# Patient Record
Sex: Female | Born: 1990 | Race: Black or African American | Hispanic: No | Marital: Single | Smoking: Never smoker
Health system: Southern US, Community
[De-identification: ages and names within clinical notes are randomized; demographics above are authoritative.]

## PROBLEM LIST (undated history)

## (undated) DIAGNOSIS — I10 Essential (primary) hypertension: Secondary | ICD-10-CM

## (undated) DIAGNOSIS — K219 Gastro-esophageal reflux disease without esophagitis: Secondary | ICD-10-CM

## (undated) HISTORY — DX: Essential (primary) hypertension: I10

---
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
## 2010-02-26 ENCOUNTER — Emergency Department: Payer: Self-pay | Admitting: Emergency Medicine

## 2010-02-27 ENCOUNTER — Emergency Department: Payer: Self-pay | Admitting: Emergency Medicine

## 2010-05-24 ENCOUNTER — Ambulatory Visit: Payer: Self-pay | Admitting: Family Medicine

## 2010-05-28 ENCOUNTER — Emergency Department: Payer: Self-pay | Admitting: Emergency Medicine

## 2010-05-30 ENCOUNTER — Other Ambulatory Visit: Payer: Self-pay | Admitting: Emergency Medicine

## 2010-06-04 ENCOUNTER — Inpatient Hospital Stay (HOSPITAL_COMMUNITY)
Admission: AD | Admit: 2010-06-04 | Discharge: 2010-06-04 | Payer: Self-pay | Source: Home / Self Care | Attending: Obstetrics & Gynecology | Admitting: Obstetrics & Gynecology

## 2010-08-27 ENCOUNTER — Emergency Department: Payer: Self-pay | Admitting: Emergency Medicine

## 2010-10-16 ENCOUNTER — Observation Stay: Payer: Self-pay

## 2010-11-18 ENCOUNTER — Observation Stay: Payer: Self-pay

## 2011-01-07 ENCOUNTER — Observation Stay: Payer: Self-pay | Admitting: Obstetrics and Gynecology

## 2011-01-23 ENCOUNTER — Inpatient Hospital Stay: Payer: Self-pay | Admitting: Obstetrics and Gynecology

## 2011-05-16 ENCOUNTER — Emergency Department: Payer: Self-pay | Admitting: Emergency Medicine

## 2011-12-22 ENCOUNTER — Emergency Department: Payer: Self-pay | Admitting: Emergency Medicine

## 2011-12-22 LAB — URINALYSIS, COMPLETE
Bilirubin,UR: NEGATIVE
Glucose,UR: NEGATIVE mg/dL (ref 0–75)
Ph: 6 (ref 4.5–8.0)
RBC,UR: 2 /HPF (ref 0–5)
Squamous Epithelial: 16

## 2011-12-22 LAB — COMPREHENSIVE METABOLIC PANEL
Albumin: 3.5 g/dL (ref 3.4–5.0)
Alkaline Phosphatase: 91 U/L (ref 50–136)
Anion Gap: 8 (ref 7–16)
Calcium, Total: 9.1 mg/dL (ref 8.5–10.1)
Co2: 27 mmol/L (ref 21–32)
Creatinine: 0.77 mg/dL (ref 0.60–1.30)
SGOT(AST): 20 U/L (ref 15–37)
SGPT (ALT): 18 U/L
Total Protein: 8.6 g/dL — ABNORMAL HIGH (ref 6.4–8.2)

## 2011-12-22 LAB — CBC
HCT: 42.4 % (ref 35.0–47.0)
HGB: 14 g/dL (ref 12.0–16.0)
MCHC: 33 g/dL (ref 32.0–36.0)

## 2011-12-22 LAB — HCG, QUANTITATIVE, PREGNANCY: Beta Hcg, Quant.: <1 m[IU]/mL — ABNORMAL LOW

## 2011-12-23 LAB — URINE CULTURE

## 2012-10-24 IMAGING — US US OB < 14 WEEKS - US OB TV
1 series · 17 of 28 positions shown · non-contrast
Comparison: none

REASON FOR EXAM: eval for ectopic
COMMENTS:

[Series 1: us ob < 14 weeks - us ob tv · 17 of 69 slices shown]
[im 1/69]
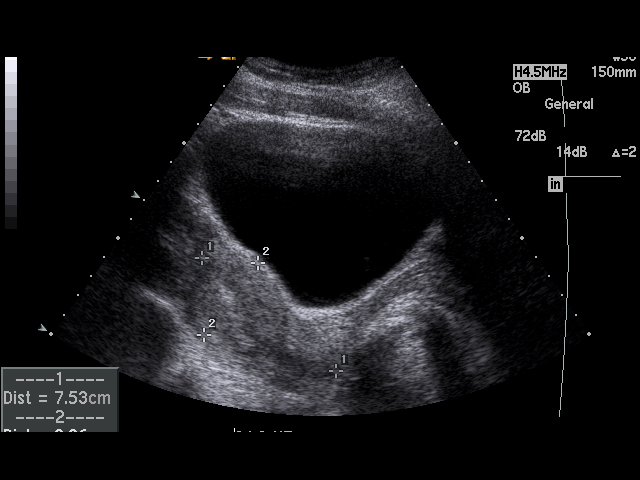
[im 6/69]
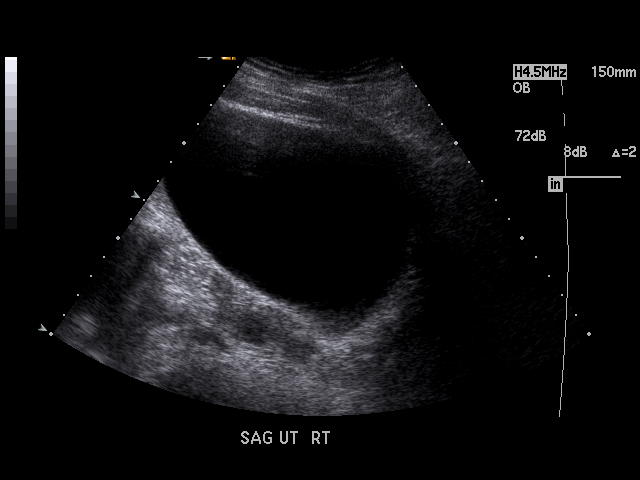
[im 11/69]
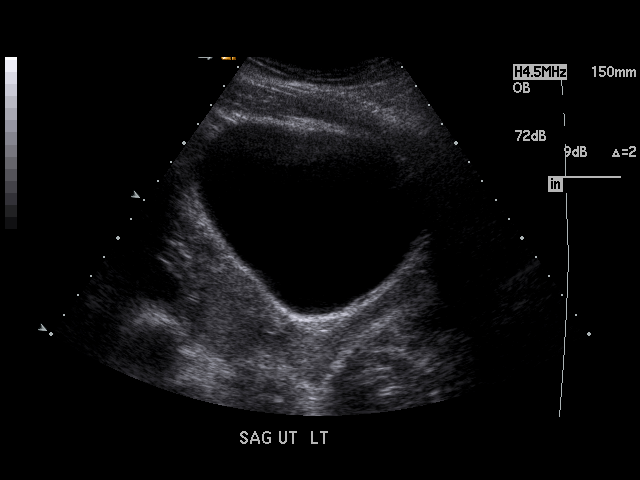
[im 13/69]
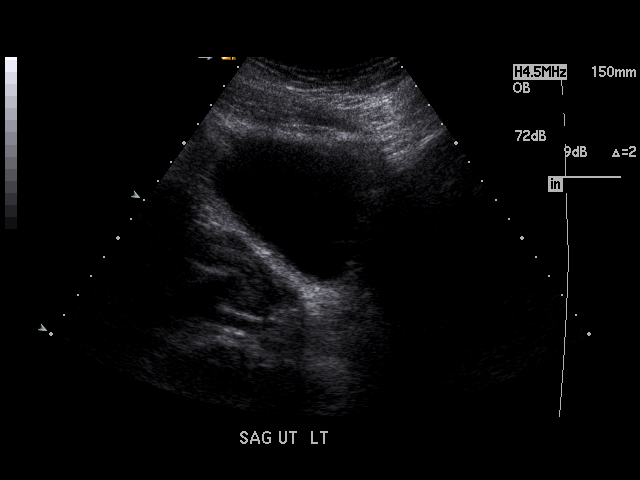
[im 18/69]
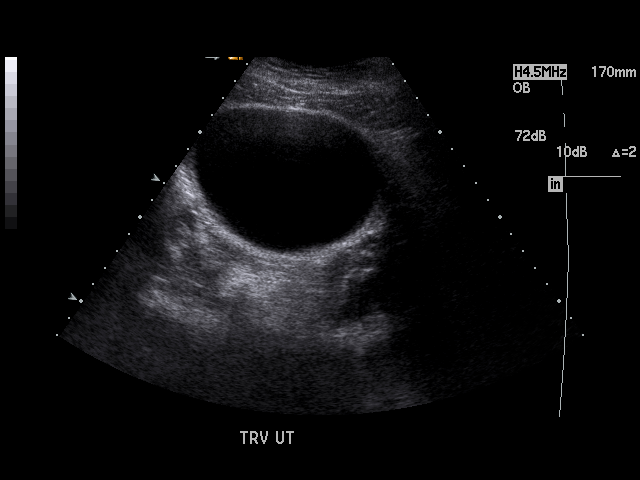
[im 23/69]
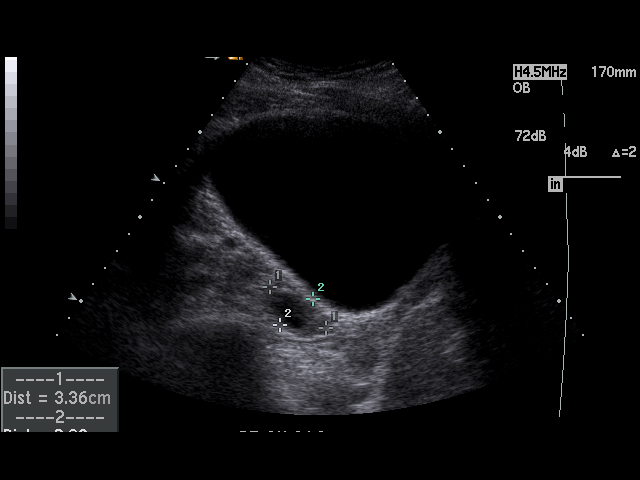
[im 26/69]
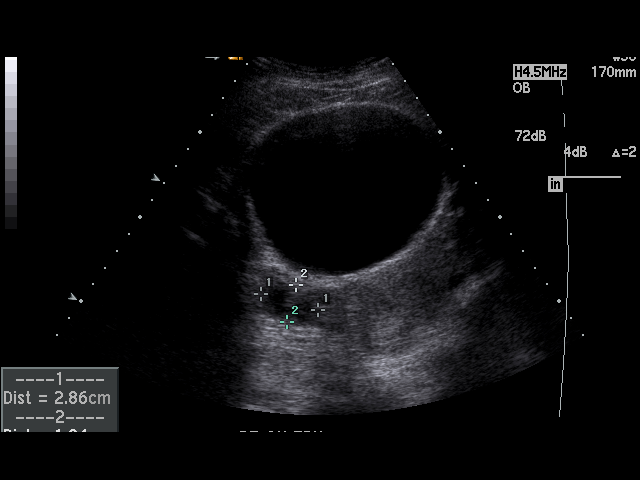
[im 31/69]
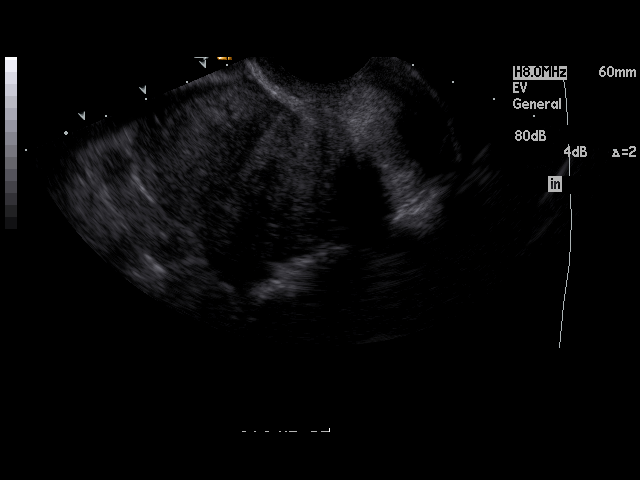
[im 36/69]
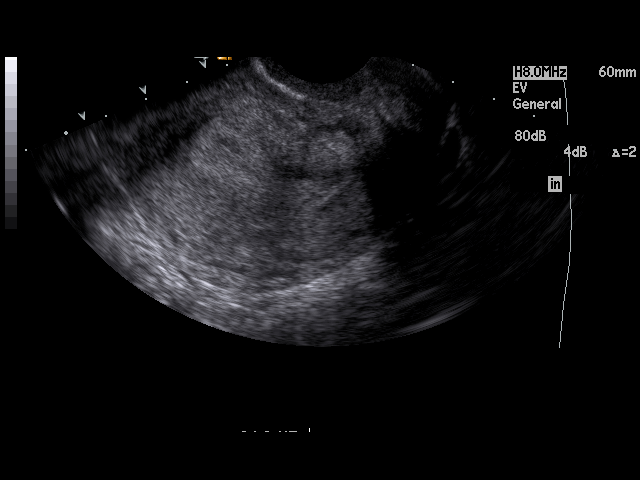
[im 38/69]
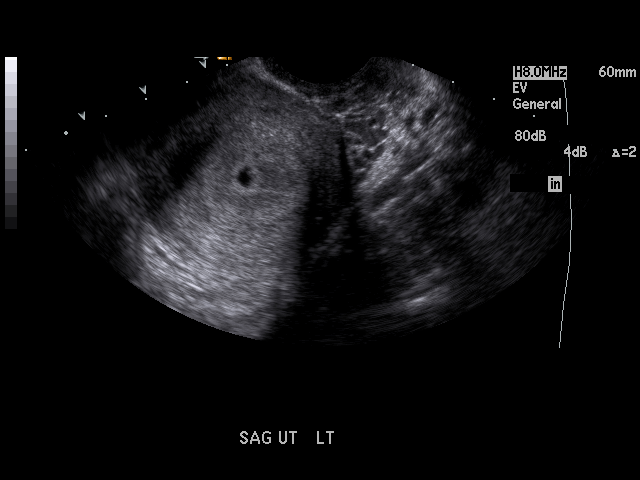
[im 43/69]
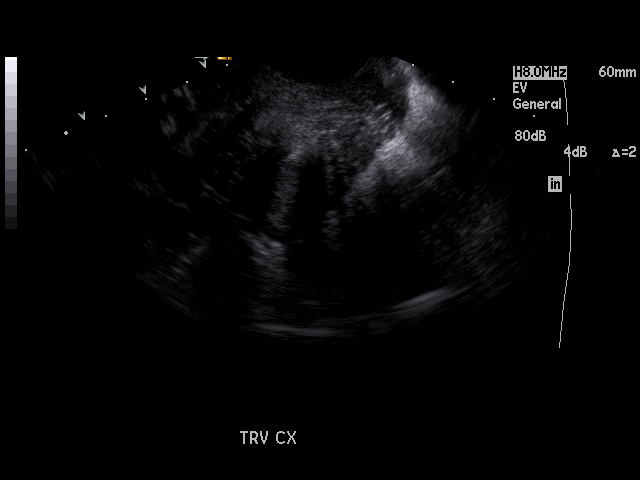
[im 46/69]
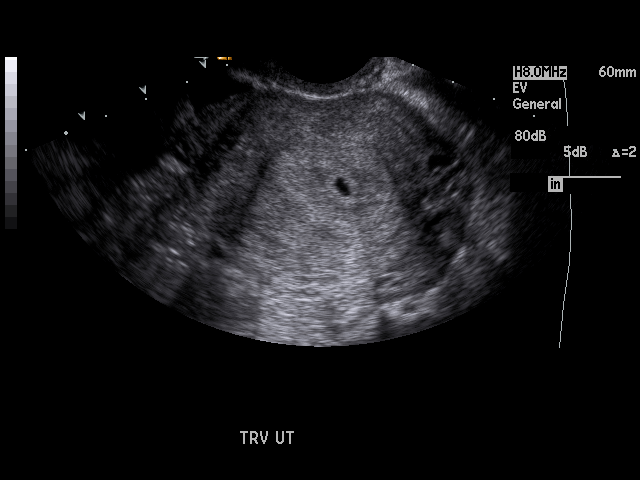
[im 51/69]
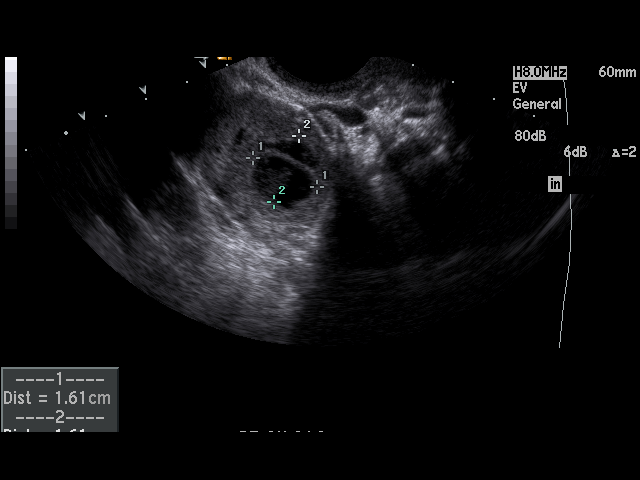
[im 56/69]
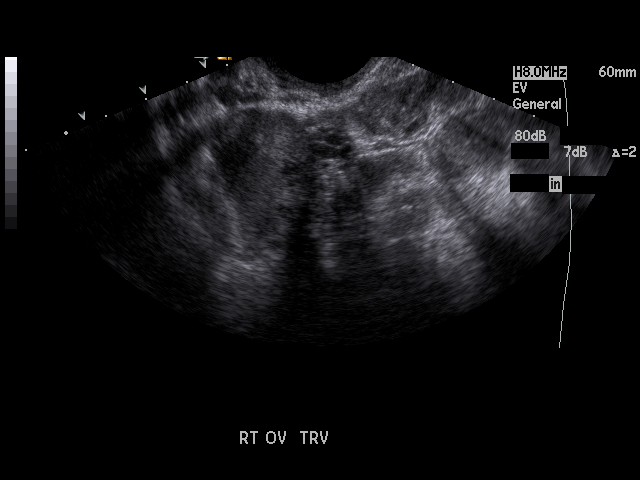
[im 58/69]
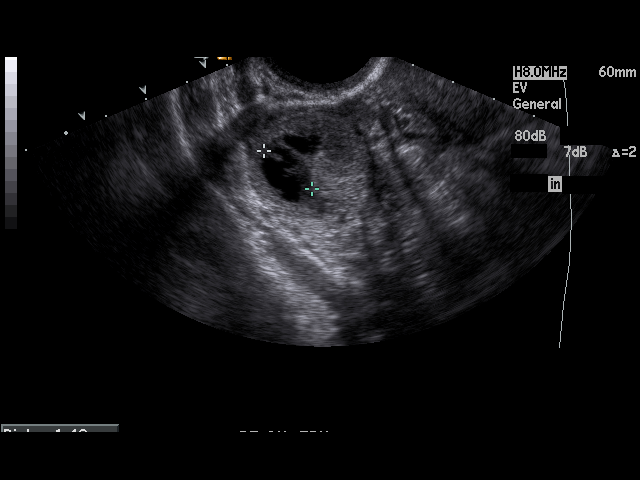
[im 63/69]
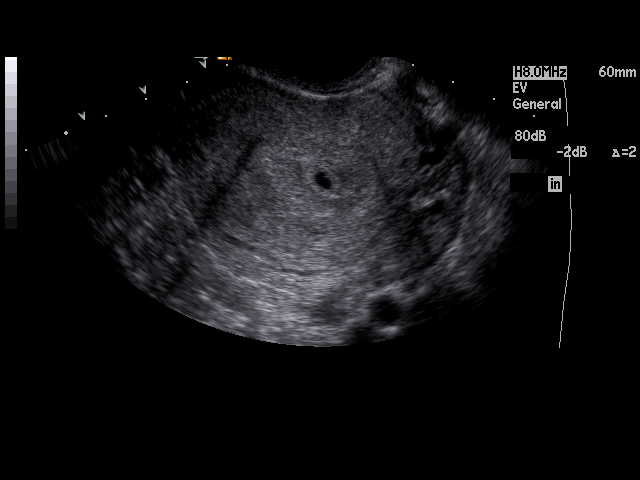
[im 69/69]
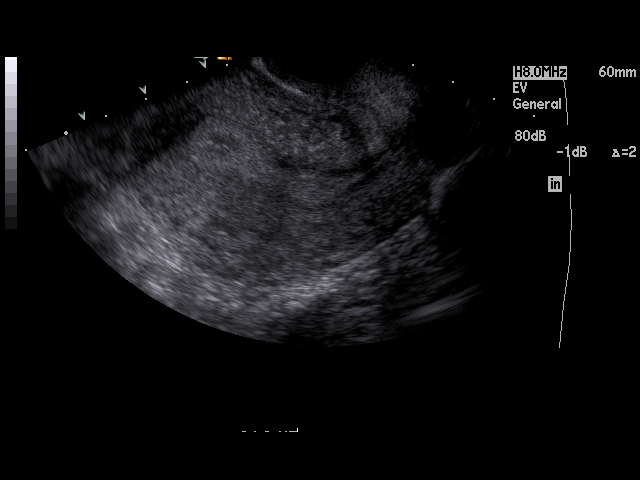

[17 of 28 positions shown; findings below may reference images not displayed]

PROCEDURE:     US  - US OB LESS THAN 14 WEEKS/W TRANS  - May 28, 2010  [DATE]

RESULT:     Emergent transabdominal and endovaginal pelvic ultrasound is
performed. The study demonstrates a small intrauterine fluid collection
suggestive of a possible gestational sac with a measurement of 4.7 mm. A
pseudogestational sac of an ectopic pregnancy is not excluded. No yolk sac
or fetal pole is  evident at this time. Certainly a very early intrauterine
gestation is not excluded. Followup with quantitative serial beta-hCG exams
and ultrasound is recommended. The ovaries appear to be normal in size.
There is a predominantly cystic irregularly marginated area of echogenicity
within the right ovary with septations and internal echoes but with no
evidence of blood flow. This is nonspecific and should be followed. The
blood flow to both ovaries appears to be normal on color Doppler
interrogation.
IMPRESSION: Fluid collection within the endometrial cavity which may represent a very
early gestational sac. Correlation with serial quantitative beta-hCG and
followup ultrasound is recommended. No yolk sac or fetal pole is evident.
The gestational sac is too small to characterize a gestational age. The
possibility of this being a pseudogestational sac of an ectopic pregnancy is
not excluded. Close clinical and laboratory correlation is recommended.

## 2013-03-29 ENCOUNTER — Encounter (HOSPITAL_COMMUNITY): Payer: Self-pay | Admitting: Emergency Medicine

## 2013-03-29 ENCOUNTER — Emergency Department (HOSPITAL_COMMUNITY)
Admission: EM | Admit: 2013-03-29 | Discharge: 2013-03-29 | Disposition: A | Discharge status: Home / Self Care | Payer: Managed Care, Other (non HMO) | Attending: Emergency Medicine | Admitting: Emergency Medicine

## 2013-03-29 DIAGNOSIS — M549 Dorsalgia, unspecified: Secondary | ICD-10-CM | POA: Insufficient documentation

## 2013-03-29 DIAGNOSIS — Z3202 Encounter for pregnancy test, result negative: Secondary | ICD-10-CM | POA: Insufficient documentation

## 2013-03-29 DIAGNOSIS — R112 Nausea with vomiting, unspecified: Secondary | ICD-10-CM | POA: Insufficient documentation

## 2013-03-29 DIAGNOSIS — R5381 Other malaise: Secondary | ICD-10-CM | POA: Insufficient documentation

## 2013-03-29 DIAGNOSIS — R509 Fever, unspecified: Secondary | ICD-10-CM | POA: Insufficient documentation

## 2013-03-29 DIAGNOSIS — J020 Streptococcal pharyngitis: Secondary | ICD-10-CM | POA: Insufficient documentation

## 2013-03-29 DIAGNOSIS — R630 Anorexia: Secondary | ICD-10-CM | POA: Insufficient documentation

## 2013-03-29 LAB — CBC WITH DIFFERENTIAL/PLATELET
Basophils Absolute: 0 10*3/uL (ref 0.0–0.1)
Eosinophils Absolute: 0.2 10*3/uL (ref 0.0–0.7)
Eosinophils Relative: 1 % (ref 0–5)
MCH: 27.7 pg (ref 26.0–34.0)
MCV: 82 fL (ref 78.0–100.0)
Neutrophils Relative %: 84 % — ABNORMAL HIGH (ref 43–77)
Platelets: 269 10*3/uL (ref 150–400)
RDW: 13.6 % (ref 11.5–15.5)
WBC: 12.8 10*3/uL — ABNORMAL HIGH (ref 4.0–10.5)

## 2013-03-29 LAB — URINALYSIS, ROUTINE W REFLEX MICROSCOPIC
Bilirubin Urine: NEGATIVE
Glucose, UA: NEGATIVE mg/dL
Hgb urine dipstick: NEGATIVE
Nitrite: NEGATIVE
Specific Gravity, Urine: 1.026 (ref 1.005–1.030)
pH: 8 (ref 5.0–8.0)

## 2013-03-29 LAB — COMPREHENSIVE METABOLIC PANEL
ALT: 16 U/L (ref 0–35)
AST: 22 U/L (ref 0–37)
Albumin: 3.7 g/dL (ref 3.5–5.2)
Calcium: 9.5 mg/dL (ref 8.4–10.5)
Potassium: 4 mEq/L (ref 3.5–5.1)
Sodium: 133 mEq/L — ABNORMAL LOW (ref 135–145)
Total Protein: 8.3 g/dL (ref 6.0–8.3)

## 2013-03-29 MED ORDER — PENICILLIN G BENZATHINE 1200000 UNIT/2ML IM SUSP
1.2000 10*6.[IU] | Freq: Once | INTRAMUSCULAR | Status: AC
  Administered 2013-03-29: 1.2 10*6.[IU] via INTRAMUSCULAR
  Filled 2013-03-29: qty 2

## 2013-03-29 MED ORDER — DEXAMETHASONE SODIUM PHOSPHATE 10 MG/ML IJ SOLN
10.0000 mg | Freq: Once | INTRAMUSCULAR | Status: AC
  Administered 2013-03-29: 10 mg via INTRAMUSCULAR
  Filled 2013-03-29: qty 1

## 2013-03-29 MED ORDER — KETOROLAC TROMETHAMINE 60 MG/2ML IM SOLN
60.0000 mg | Freq: Once | INTRAMUSCULAR | Status: AC
  Administered 2013-03-29: 60 mg via INTRAMUSCULAR
  Filled 2013-03-29: qty 2

## 2013-03-29 MED ORDER — ONDANSETRON 4 MG PO TBDP
8.0000 mg | ORAL_TABLET | Freq: Once | ORAL | Status: AC
  Administered 2013-03-29: 8 mg via ORAL
  Filled 2013-03-29: qty 2

## 2013-03-29 MED ORDER — ACETAMINOPHEN 325 MG PO TABS
650.0000 mg | ORAL_TABLET | Freq: Once | ORAL | Status: AC
  Administered 2013-03-29: 650 mg via ORAL
  Filled 2013-03-29: qty 2

## 2013-03-29 MED ORDER — NAPROXEN 500 MG PO TABS: 500.0000 mg | ORAL_TABLET | Freq: Two times a day (BID) | ORAL | Status: DC

## 2013-03-29 MED ORDER — ONDANSETRON 8 MG PO TBDP: 8.0000 mg | ORAL_TABLET | Freq: Three times a day (TID) | ORAL | Status: DC | PRN

## 2013-03-29 NOTE — ED Provider Notes (Signed)
CSN: [id number]     Arrival date & time 03/29/13  1710 History   First MD Initiated Contact with Patient 03/29/13 1827     Chief Complaint  Patient presents with  . Emesis  . Back Pain  . Fever   (Consider location/radiation/quality/duration/timing/severity/associated sxs/prior Treatment) HPI Comments: Patient presents with complaint of sore throat, fever, back pain, multiple episodes of vomiting that started late last night/early this morning. She has tried Robitussin without relief. She denies runny nose, stuffy nose, headache, ear pain, neck pain, cough, diarrhea, dysuria or hematuria. No sick contacts. No recent travel. Onset of symptoms gradual. Course is gradually worsening. Nothing makes symptoms better/worse.  Patient is a 22 y.o. female presenting with vomiting, back pain, and fever. The history is provided by the patient.  Emesis Associated symptoms: chills and sore throat   Associated symptoms: no abdominal pain, no diarrhea, no headaches and no myalgias   Back Pain Associated symptoms: fever   Associated symptoms: no abdominal pain, no chest pain, no dysuria, no headaches and no pelvic pain   Fever Associated symptoms: chills, nausea, sore throat and vomiting   Associated symptoms: no chest pain, no congestion, no cough, no diarrhea, no dysuria, no ear pain, no headaches, no myalgias, no rash and no rhinorrhea     History reviewed. No pertinent past medical history. History reviewed. No pertinent past surgical history. History reviewed. No pertinent family history. History  Substance Use Topics  . Smoking status: Never Smoker   . Smokeless tobacco: Not on file  . Alcohol Use: No   OB History   Grav Para Term Preterm Abortions TAB SAB Ect Mult Living                 Review of Systems  Constitutional: Positive for fever, chills, appetite change and fatigue.  HENT: Positive for sore throat. Negative for congestion, ear pain, rhinorrhea and sinus pressure.   Eyes:  Negative for redness.  Respiratory: Negative for cough and wheezing.   Cardiovascular: Negative for chest pain.  Gastrointestinal: Positive for nausea and vomiting. Negative for abdominal pain and diarrhea.  Genitourinary: Negative for dysuria, frequency, hematuria, decreased urine volume, vaginal bleeding and pelvic pain.  Musculoskeletal: Positive for back pain. Negative for myalgias and neck stiffness.  Skin: Negative for rash.  Neurological: Negative for headaches.  Hematological: Negative for adenopathy.    Allergies  Review of patient's allergies indicates no known allergies.  Home Medications   Current Outpatient Rx  Name  Route  Sig  Dispense  Refill  . guaifenesin (ROBITUSSIN) 100 MG/5ML syrup   Oral   Take 400 mg by mouth every 4 (four) hours as needed for cough.          BP 118/60  Pulse 112  Temp(Src) 101.2 F (38.4 C) (Oral)  Resp 18  SpO2 99%  LMP 02/27/2013 Physical Exam  Nursing note and vitals reviewed. Constitutional: She appears well-developed and well-nourished.  HENT:  Head: Normocephalic and atraumatic.  Right Ear: Tympanic membrane, external ear and ear canal normal.  Left Ear: Tympanic membrane, external ear and ear canal normal.  Nose: Nose normal. No mucosal edema or rhinorrhea.  Mouth/Throat: Uvula is midline and mucous membranes are normal. Mucous membranes are not dry. No oral lesions. No trismus in the jaw. No uvula swelling. Oropharyngeal exudate, posterior oropharyngeal edema and posterior oropharyngeal erythema present. No tonsillar abscesses.  Eyes: Conjunctivae are normal. Right eye exhibits no discharge. Left eye exhibits no discharge.  Neck: Normal range of  motion. Neck supple.  Cardiovascular: Normal rate, regular rhythm and normal heart sounds.   Pulmonary/Chest: Effort normal and breath sounds normal. No respiratory distress. She has no wheezes. She has no rales.  Abdominal: Soft. There is no tenderness. There is no CVA tenderness.   Lymphadenopathy:    She has no cervical adenopathy.  Neurological: She is alert.  Skin: Skin is warm and dry.  Psychiatric: She has a normal mood and affect.    ED Course  Procedures (including critical care time) Labs Review Labs Reviewed  CBC WITH DIFFERENTIAL - Abnormal; Notable for the following:    WBC 12.8 (*)    Neutrophils Relative % 84 (*)    Neutro Abs 10.7 (*)    Lymphocytes Relative 10 (*)    All other components within normal limits  COMPREHENSIVE METABOLIC PANEL - Abnormal; Notable for the following:    Sodium 133 (*)    Glucose, Bld 103 (*)    All other components within normal limits  URINALYSIS, ROUTINE W REFLEX MICROSCOPIC  POCT PREGNANCY, URINE   Imaging Review No results found.  EKG Interpretation   None      7:58 PM Patient seen and examined. Work-up initiated. Medications ordered.   Vital signs reviewed and are as follows: Filed Vitals:   03/29/13 1918  BP: 118/60  Pulse: 112  Temp:   Resp: 18   8:03 PM UA neg. UPT neg.   Patient urged to return with worsening symptoms or other concerns. Patient verbalized understanding and agrees with plan.    MDM   1. Streptococcal pharyngitis    Centor 4/4, treat for strep throat. UA is pending. Doubt pyelo given presentation and alternative source.    Renne Crigler, PA-C 03/29/13 2013

## 2013-03-29 NOTE — ED Notes (Signed)
Pt reports fever, back pain and vomiting that started this morning. Denies any recent travel or being around anyone sick. Denies any urinary symptoms at this time. Also reports some dizziness.

## 2013-03-29 NOTE — ED Provider Notes (Signed)
  Medical screening examination/treatment/procedure(s) were performed by non-physician practitioner and as supervising physician I was immediately available for consultation/collaboration.   Gerhard Munch, MD 03/29/13 2016

## 2013-03-29 NOTE — ED Notes (Signed)
Pt states understanding of discharge instructions 

## 2013-03-31 ENCOUNTER — Emergency Department: Payer: Self-pay | Admitting: Emergency Medicine

## 2013-04-28 ENCOUNTER — Emergency Department: Payer: Self-pay | Admitting: Internal Medicine

## 2013-04-28 LAB — URINALYSIS, COMPLETE
Blood: NEGATIVE
Glucose,UR: NEGATIVE mg/dL (ref 0–75)
Nitrite: NEGATIVE
RBC,UR: NONE SEEN /HPF (ref 0–5)
WBC UR: 2 /HPF (ref 0–5)

## 2014-03-15 ENCOUNTER — Emergency Department: Payer: Self-pay | Admitting: Emergency Medicine

## 2014-03-15 LAB — COMPREHENSIVE METABOLIC PANEL
ALBUMIN: 2.9 g/dL — AB (ref 3.4–5.0)
ALT: 38 U/L
ANION GAP: 5 — AB (ref 7–16)
Alkaline Phosphatase: 100 U/L
BUN: 7 mg/dL (ref 7–18)
Bilirubin,Total: 0.4 mg/dL (ref 0.2–1.0)
CALCIUM: 8.6 mg/dL (ref 8.5–10.1)
Chloride: 110 mmol/L — ABNORMAL HIGH (ref 98–107)
Co2: 26 mmol/L (ref 21–32)
Creatinine: 0.8 mg/dL (ref 0.60–1.30)
Glucose: 94 mg/dL (ref 65–99)
Osmolality: 279 (ref 275–301)
POTASSIUM: 4 mmol/L (ref 3.5–5.1)
SGOT(AST): 27 U/L (ref 15–37)
Sodium: 141 mmol/L (ref 136–145)
TOTAL PROTEIN: 7.7 g/dL (ref 6.4–8.2)

## 2014-03-15 LAB — CBC
HCT: 35.7 % (ref 35.0–47.0)
HGB: 11 g/dL — AB (ref 12.0–16.0)
MCH: 24.5 pg — ABNORMAL LOW (ref 26.0–34.0)
MCHC: 30.9 g/dL — AB (ref 32.0–36.0)
MCV: 79 fL — ABNORMAL LOW (ref 80–100)
Platelet: 292 10*3/uL (ref 150–440)
RBC: 4.49 10*6/uL (ref 3.80–5.20)
RDW: 15.3 % — ABNORMAL HIGH (ref 11.5–14.5)
WBC: 6.5 10*3/uL (ref 3.6–11.0)

## 2014-03-15 LAB — URINALYSIS, COMPLETE
Bilirubin,UR: NEGATIVE
GLUCOSE, UR: NEGATIVE mg/dL (ref 0–75)
Ketone: NEGATIVE
LEUKOCYTE ESTERASE: NEGATIVE
Nitrite: NEGATIVE
PROTEIN: NEGATIVE
Ph: 6 (ref 4.5–8.0)
SPECIFIC GRAVITY: 1.017 (ref 1.003–1.030)
Squamous Epithelial: 1
WBC UR: 1 /HPF (ref 0–5)

## 2014-03-15 LAB — LIPASE, BLOOD: Lipase: 140 U/L (ref 73–393)

## 2015-08-15 ENCOUNTER — Encounter: Payer: Self-pay | Admitting: Emergency Medicine

## 2015-08-15 ENCOUNTER — Emergency Department
Admission: EM | Admit: 2015-08-15 | Discharge: 2015-08-15 | Disposition: A | Discharge status: Home / Self Care | Payer: Managed Care, Other (non HMO) | Attending: Emergency Medicine | Admitting: Emergency Medicine

## 2015-08-15 DIAGNOSIS — Z791 Long term (current) use of non-steroidal anti-inflammatories (NSAID): Secondary | ICD-10-CM | POA: Insufficient documentation

## 2015-08-15 DIAGNOSIS — J069 Acute upper respiratory infection, unspecified: Secondary | ICD-10-CM

## 2015-08-15 DIAGNOSIS — J029 Acute pharyngitis, unspecified: Secondary | ICD-10-CM | POA: Diagnosis present

## 2015-08-15 MED ORDER — MAGIC MOUTHWASH W/LIDOCAINE: 5.0000 mL | Freq: Four times a day (QID) | ORAL | Status: DC

## 2015-08-15 MED ORDER — FLUTICASONE PROPIONATE 50 MCG/ACT NA SUSP
1.0000 | Freq: Two times a day (BID) | NASAL | Status: DC
Start: 2015-08-15 — End: 2019-04-29

## 2015-08-15 MED ORDER — CETIRIZINE HCL 10 MG PO TABS: 10.0000 mg | ORAL_TABLET | Freq: Every day | ORAL | Status: DC

## 2015-08-15 NOTE — Discharge Instructions (Signed)
Viral Infections °A viral infection can be caused by different types of viruses. Most viral infections are not serious and resolve on their own. However, some infections may cause severe symptoms and may lead to further complications. °SYMPTOMS °Viruses can frequently cause: °· Minor sore throat. °· Aches and pains. °· Headaches. °· Runny nose. °· Different types of rashes. °· Watery eyes. °· Tiredness. °· Cough. °· Loss of appetite. °· Gastrointestinal infections, resulting in nausea, vomiting, and diarrhea. °These symptoms do not respond to antibiotics because the infection is not caused by bacteria. However, you might catch a bacterial infection following the viral infection. This is sometimes called a "superinfection." Symptoms of such a bacterial infection may include: °· Worsening sore throat with pus and difficulty swallowing. °· Swollen neck glands. °· Chills and a high or persistent fever. °· Severe headache. °· Tenderness over the sinuses. °· Persistent overall ill feeling (malaise), muscle aches, and tiredness (fatigue). °· Persistent cough. °· Yellow, green, or brown mucus production with coughing. °HOME CARE INSTRUCTIONS  °· Only take over-the-counter or prescription medicines for pain, discomfort, diarrhea, or fever as directed by your caregiver. °· Drink enough water and fluids to keep your urine clear or pale yellow. Sports drinks can provide valuable electrolytes, sugars, and hydration. °· Get plenty of rest and maintain proper nutrition. Soups and broths with crackers or rice are fine. °SEEK IMMEDIATE MEDICAL CARE IF:  °· You have severe headaches, shortness of breath, chest pain, neck pain, or an unusual rash. °· You have uncontrolled vomiting, diarrhea, or you are unable to keep down fluids. °· You or your child has an oral temperature above 102° F (38.9° C), not controlled by medicine. °· Your baby is older than 3 months with a rectal temperature of 102° F (38.9° C) or higher. °· Your baby is 3  months old or younger with a rectal temperature of 100.4° F (38° C) or higher. °MAKE SURE YOU:  °· Understand these instructions. °· Will watch your condition. °· Will get help right away if you are not doing well or get worse. °  °This information is not intended to replace advice given to you by your health care provider. Make sure you discuss any questions you have with your health care provider. °  °Document Released: 03/05/2005 Document Revised: 08/18/2011 Document Reviewed: 11/01/2014 °Elsevier Interactive Patient Education ©2016 [employer] ° °

## 2015-08-15 NOTE — ED Notes (Signed)
Sore throat today   Positive fever Yesenia Andrade/chills

## 2015-08-15 NOTE — ED Provider Notes (Signed)
[facility] Emergency Department Provider Note  ____________________________________________  Time seen: Approximately 7:30 PM  I have reviewed the triage vital signs and the nursing notes.   HISTORY  Chief Complaint Sore Throat    HPI Yesenia Andrade is a 25 y.o. female who presents emergency department complaining of sore throat, nasal congestion, cough. She endorses a subjective tactile fever. She denies any headache, visual acuity changes, chest pain, shortness of breath, abdominal pain. Patient has not taken any medications prior to arrival.   History reviewed. No pertinent past medical history.  There are no active problems to display for this patient.   History reviewed. No pertinent past surgical history.  Current Outpatient Rx  Name  Route  Sig  Dispense  Refill  . cetirizine (ZYRTEC) 10 MG tablet   Oral   Take 1 tablet (10 mg total) by mouth daily.   30 tablet   0   . fluticasone (FLONASE) 50 MCG/ACT nasal spray   Each Nare   Place 1 spray into both nostrils 2 (two) times daily.   16 g   0   . guaifenesin (ROBITUSSIN) 100 MG/5ML syrup   Oral   Take 400 mg by mouth every 4 (four) hours as needed for cough.         . magic mouthwash w/lidocaine SOLN   Oral   Take 5 mLs by mouth 4 (four) times daily.   240 mL   0     Dispense in a 1/1/1/1 ratio. Use lidocaine, diphen ...   . naproxen (NAPROSYN) 500 MG tablet   Oral   Take 1 tablet (500 mg total) by mouth 2 (two) times daily.   20 tablet   0   . ondansetron (ZOFRAN ODT) 8 MG disintegrating tablet   Oral   Take 1 tablet (8 mg total) by mouth every 8 (eight) hours as needed for nausea.   6 tablet   0     Allergies Review of patient's allergies indicates no known allergies.  No family history on file.  Social History Social History  Substance Use Topics  . Smoking status: Never Smoker   . Smokeless tobacco: None  . Alcohol Use: No     Review of Systems   Constitutional: No fever/chills Eyes: No visual changes. No discharge ENT: Positive sore throat. Positive nasal congestion. Cardiovascular: no chest pain. Respiratory: Positive cough. No SOB. Skin: Negative for rash. Neurological: Negative for headaches, focal weakness or numbness. 10-point ROS otherwise negative.  ____________________________________________   PHYSICAL EXAM:  VITAL SIGNS: ED Triage Vitals  Enc Vitals Group     BP 08/15/15 1902 136/87 mmHg     Pulse Rate 08/15/15 1902 110     Resp 08/15/15 1902 20     Temp 08/15/15 1902 98.5 F (36.9 C)     Temp Source 08/15/15 1902 Oral     SpO2 08/15/15 1902 98 %     Weight 08/15/15 1902 215 lb (97.523 kg)     Height 08/15/15 1902  (1.626 m)     Head Cir --      Peak Flow --      Pain Score 08/15/15 1920 7     Pain Loc --      Pain Edu? --      Excl. in GC? --      Constitutional: Alert and oriented. Well appearing and in no acute distress. Eyes: Conjunctivae are normal. PERRL. EOMI. Head: Atraumatic. ENT:  Ears: EACs and TMs are unremarkable bilaterally.      Nose: Moderate clear congestion/rhinnorhea.      Mouth/Throat: Mucous membranes are moist. Oropharynx is mildly erythematous but not edematous. Uvula is midline. Postnasal drip is identified. Tonsils are unremarkable bilaterally. Neck: No stridor.   Hematological/Lymphatic/Immunilogical: No cervical lymphadenopathy. Cardiovascular: Normal rate, regular rhythm. Normal S1 and S2.  Good peripheral circulation. Respiratory: Normal respiratory effort without tachypnea or retractions. Lungs CTAB. Neurologic:  Normal speech and language. No gross focal neurologic deficits are appreciated.  Skin:  Skin is warm, dry and intact. No rash noted. Psychiatric: Mood and affect are normal. Speech and behavior are normal. Patient exhibits appropriate insight and judgement.   ____________________________________________   LABS (all labs ordered are listed, but  only abnormal results are displayed)  Labs Reviewed - No data to display ____________________________________________  EKG   ____________________________________________  RADIOLOGY   No results found.  ____________________________________________    PROCEDURES  Procedure(s) performed:       Medications - No data to display   ____________________________________________   INITIAL IMPRESSION / ASSESSMENT AND PLAN / ED COURSE  Pertinent labs & imaging results that were available during my care of the patient were reviewed by me and considered in my medical decision making (see chart for details).  Patient's diagnosis is consistent with viral upper respiratory illness. Patient will be discharged home with prescriptions for symptomatic medications of Zyrtec, Flonase, Magic mouthwash. Patient is to follow up with primary care provider if symptoms persist past this treatment course. Patient is given ED precautions to return to the ED for any worsening or new symptoms.     ____________________________________________  FINAL CLINICAL IMPRESSION(S) / ED DIAGNOSES  Final diagnoses:  Viral upper respiratory infection      NEW MEDICATIONS STARTED DURING THIS VISIT:  New Prescriptions   CETIRIZINE (ZYRTEC) 10 MG TABLET    Take 1 tablet (10 mg total) by mouth daily.   FLUTICASONE (FLONASE) 50 MCG/ACT NASAL SPRAY    Place 1 spray into both nostrils 2 (two) times daily.   MAGIC MOUTHWASH W/LIDOCAINE SOLN    Take 5 mLs by mouth 4 (four) times daily.        This chart was dictated using voice recognition software/Dragon. Despite best efforts to proofread, errors can occur which can change the meaning. Any change was purely unintentional.    Racheal PatchesJonathan D Traeh Milroy, PA-C 08/15/15 1938  Jennye MoccasinBrian S Quigley, MD 08/15/15 [phone]

## 2016-12-09 DIAGNOSIS — R1011 Right upper quadrant pain: Secondary | ICD-10-CM | POA: Insufficient documentation

## 2016-12-09 DIAGNOSIS — Z6841 Body Mass Index (BMI) 40.0 and over, adult: Secondary | ICD-10-CM | POA: Insufficient documentation

## 2016-12-09 DIAGNOSIS — K8010 Calculus of gallbladder with chronic cholecystitis without obstruction: Secondary | ICD-10-CM | POA: Insufficient documentation

## 2017-01-19 DIAGNOSIS — Z09 Encounter for follow-up examination after completed treatment for conditions other than malignant neoplasm: Secondary | ICD-10-CM | POA: Insufficient documentation

## 2017-03-09 HISTORY — PX: CHOLECYSTECTOMY: SHX55

## 2019-02-18 ENCOUNTER — Other Ambulatory Visit: Payer: Self-pay

## 2019-02-18 ENCOUNTER — Encounter (HOSPITAL_BASED_OUTPATIENT_CLINIC_OR_DEPARTMENT_OTHER): Payer: Self-pay

## 2019-02-18 ENCOUNTER — Emergency Department (HOSPITAL_BASED_OUTPATIENT_CLINIC_OR_DEPARTMENT_OTHER): Payer: Medicaid Other

## 2019-02-18 ENCOUNTER — Emergency Department (HOSPITAL_BASED_OUTPATIENT_CLINIC_OR_DEPARTMENT_OTHER)
Admission: EM | Admit: 2019-02-18 | Discharge: 2019-02-18 | Disposition: A | Discharge status: Home / Self Care | Payer: Medicaid Other | Attending: Emergency Medicine | Admitting: Emergency Medicine

## 2019-02-18 DIAGNOSIS — Z79899 Other long term (current) drug therapy: Secondary | ICD-10-CM | POA: Insufficient documentation

## 2019-02-18 DIAGNOSIS — K439 Ventral hernia without obstruction or gangrene: Secondary | ICD-10-CM | POA: Insufficient documentation

## 2019-02-18 DIAGNOSIS — R109 Unspecified abdominal pain: Secondary | ICD-10-CM | POA: Diagnosis present

## 2019-02-18 LAB — CBC WITH DIFFERENTIAL/PLATELET
Abs Immature Granulocytes: 0.02 10*3/uL (ref 0.00–0.07)
Basophils Absolute: 0 10*3/uL (ref 0.0–0.1)
Basophils Relative: 0 %
Eosinophils Absolute: 0.1 10*3/uL (ref 0.0–0.5)
Eosinophils Relative: 3 %
HCT: 39.1 % (ref 36.0–46.0)
Hemoglobin: 11.9 g/dL — ABNORMAL LOW (ref 12.0–15.0)
Immature Granulocytes: 1 %
Lymphocytes Relative: 22 %
Lymphs Abs: 0.9 10*3/uL (ref 0.7–4.0)
MCH: 25.3 pg — ABNORMAL LOW (ref 26.0–34.0)
MCHC: 30.4 g/dL (ref 30.0–36.0)
MCV: 83.2 fL (ref 80.0–100.0)
Monocytes Absolute: 0.4 10*3/uL (ref 0.1–1.0)
Monocytes Relative: 10 %
Neutro Abs: 2.5 10*3/uL (ref 1.7–7.7)
Neutrophils Relative %: 64 %
Platelets: 261 10*3/uL (ref 150–400)
RBC: 4.7 MIL/uL (ref 3.87–5.11)
RDW: 14.6 % (ref 11.5–15.5)
WBC: 3.9 10*3/uL — ABNORMAL LOW (ref 4.0–10.5)
nRBC: 0 % (ref 0.0–0.2)

## 2019-02-18 LAB — COMPREHENSIVE METABOLIC PANEL
ALT: 17 U/L (ref 0–44)
AST: 20 U/L (ref 15–41)
Albumin: 3.5 g/dL (ref 3.5–5.0)
Alkaline Phosphatase: 62 U/L (ref 38–126)
Anion gap: 9 (ref 5–15)
BUN: 13 mg/dL (ref 6–20)
CO2: 26 mmol/L (ref 22–32)
Calcium: 8.8 mg/dL — ABNORMAL LOW (ref 8.9–10.3)
Chloride: 102 mmol/L (ref 98–111)
Creatinine, Ser: 0.66 mg/dL (ref 0.44–1.00)
GFR calc Af Amer: >60 mL/min (ref >60)
GFR calc non Af Amer: >60 mL/min (ref >60)
Glucose, Bld: 109 mg/dL — ABNORMAL HIGH (ref 70–99)
Potassium: 3.9 mmol/L (ref 3.5–5.1)
Sodium: 137 mmol/L (ref 135–145)
Total Bilirubin: 0.5 mg/dL (ref 0.3–1.2)
Total Protein: 7.6 g/dL (ref 6.5–8.1)

## 2019-02-18 LAB — URINALYSIS, ROUTINE W REFLEX MICROSCOPIC
Bilirubin Urine: NEGATIVE
Glucose, UA: NEGATIVE mg/dL
Hgb urine dipstick: NEGATIVE
Ketones, ur: NEGATIVE mg/dL
Leukocytes,Ua: NEGATIVE
Nitrite: NEGATIVE
Protein, ur: NEGATIVE mg/dL
Specific Gravity, Urine: 1.025 (ref 1.005–1.030)
pH: 6 (ref 5.0–8.0)

## 2019-02-18 LAB — LIPASE, BLOOD: Lipase: 25 U/L (ref 11–51)

## 2019-02-18 LAB — PREGNANCY, URINE: Preg Test, Ur: NEGATIVE

## 2019-02-18 MED ORDER — IOHEXOL 300 MG/ML  SOLN
100.0000 mL | Freq: Once | INTRAMUSCULAR | Status: AC | PRN
  Administered 2019-02-18: 100 mL via INTRAVENOUS

## 2019-02-18 MED ORDER — HYDROCODONE-ACETAMINOPHEN 5-325 MG PO TABS: 1.0000 | ORAL_TABLET | ORAL | 0 refills | Status: DC | PRN

## 2019-02-18 MED ORDER — MORPHINE SULFATE (PF) 4 MG/ML IV SOLN
4.0000 mg | Freq: Once | INTRAVENOUS | Status: AC
  Administered 2019-02-18: 4 mg via INTRAVENOUS
  Filled 2019-02-18: qty 1

## 2019-02-18 MED ORDER — ONDANSETRON 4 MG PO TBDP: 4.0000 mg | ORAL_TABLET | Freq: Three times a day (TID) | ORAL | 0 refills | Status: DC | PRN

## 2019-02-18 MED ORDER — ONDANSETRON HCL 4 MG/2ML IJ SOLN
4.0000 mg | Freq: Once | INTRAMUSCULAR | Status: AC
  Administered 2019-02-18: 10:00:00 4 mg via INTRAVENOUS
  Filled 2019-02-18: qty 2

## 2019-02-18 MED ORDER — SODIUM CHLORIDE 0.9 % IV BOLUS
1000.0000 mL | Freq: Once | INTRAVENOUS | Status: AC
  Administered 2019-02-18: 10:00:00 1000 mL via INTRAVENOUS

## 2019-02-18 MED FILL — HYDROCODON-APAP 5-325: 5-325 | 2 days supply | Qty: 10 | Fill #0

## 2019-02-18 MED FILL — ONDANSETRON ODT 4 MG TABLET: 4 | 3 days supply | Qty: 10 | Fill #0

## 2019-02-18 NOTE — ED Triage Notes (Signed)
Pt reports has known hernia.  Beginning one hour ago reports increased pain central abdomen.  Denies nausea/vomiting.

## 2019-02-18 NOTE — ED Notes (Signed)
Family member leaving to go back to work, left contact information to pick up patient when ready

## 2019-02-18 NOTE — ED Provider Notes (Signed)
[facility] EMERGENCY DEPARTMENT Provider Note   CSN: [id number] Arrival date & time: 02/18/19  [id number]     History   Chief Complaint Chief Complaint  Patient presents with   Abdominal Pain    HPI Yesenia Andrade is a 28 y.o. female.     Pt presents to the ED today with abdominal pain.  She said sx started about 1 hr pta.  The pain is in the central portion of her abdomen.  The pt denies f/c.  Some nausea, no vomiting.     History reviewed. No pertinent past medical history.  There are no active problems to display for this patient.   History reviewed. No pertinent surgical history.   cholecystectomy  OB History   No obstetric history on file.      Home Medications    Prior to Admission medications   Medication Sig Start Date End Date Taking? Authorizing Provider  cetirizine (ZYRTEC) 10 MG tablet Take 1 tablet (10 mg total) by mouth daily. 08/15/15   Cuthriell, Delorise RoyalsJonathan D, PA-C  fluticasone (FLONASE) 50 MCG/ACT nasal spray Place 1 spray into both nostrils 2 (two) times daily. 08/15/15   Cuthriell, Delorise RoyalsJonathan D, PA-C  guaifenesin (ROBITUSSIN) 100 MG/5ML syrup Take 400 mg by mouth every 4 (four) hours as needed for cough.    [provider]  HYDROcodone-acetaminophen (NORCO/VICODIN) 5-325 MG tablet Take 1 tablet by mouth every 4 (four) hours as needed. 02/18/19   Jacalyn LefevreHaviland, Leylah Tarnow, MD  magic mouthwash w/lidocaine SOLN Take 5 mLs by mouth 4 (four) times daily. 08/15/15   Cuthriell, Delorise RoyalsJonathan D, PA-C  naproxen (NAPROSYN) 500 MG tablet Take 1 tablet (500 mg total) by mouth 2 (two) times daily. 03/29/13   Renne CriglerGeiple, Joshua, PA-C  ondansetron (ZOFRAN ODT) 4 MG disintegrating tablet Take 1 tablet (4 mg total) by mouth every 8 (eight) hours as needed. 02/18/19   Jacalyn LefevreHaviland, Abrea Henle, MD    Family History History reviewed. No pertinent family history.  Social History Social History   Tobacco Use   Smoking status: Never Smoker   Smokeless tobacco: Never Used    Substance Use Topics   Alcohol use: No   Drug use: No     Allergies   Patient has no known allergies.   Review of Systems Review of Systems  Gastrointestinal: Positive for abdominal pain and nausea.  All other systems reviewed and are negative.    Physical Exam Updated Vital Signs BP 134/86    Pulse 70    Temp 98.1 F (36.7 C) (Oral)    Resp 16    Ht 5\' 4"  (1.626 m)    Wt 128.4 kg    SpO2 100%    BMI 48.58 kg/m   Physical Exam Vitals signs and nursing note reviewed.  Constitutional:      Appearance: She is well-developed.  HENT:     Head: Normocephalic and atraumatic.     Mouth/Throat:     Mouth: Mucous membranes are moist.  Eyes:     Extraocular Movements: Extraocular movements intact.     Pupils: Pupils are equal, round, and reactive to light.  Cardiovascular:     Rate and Rhythm: Normal rate and regular rhythm.     Heart sounds: Normal heart sounds.  Pulmonary:     Effort: Pulmonary effort is normal.     Breath sounds: Normal breath sounds.  Abdominal:     General: Abdomen is flat. Bowel sounds are normal.     Palpations: Abdomen is soft.  Tenderness: There is generalized abdominal tenderness.     Hernia: A hernia is present. Hernia is present in the ventral area.  Skin:    General: Skin is warm.     Capillary Refill: Capillary refill takes less than 2 seconds.  Neurological:     General: No focal deficit present.     Mental Status: She is alert and oriented to person, place, and time.  Psychiatric:        Mood and Affect: Mood normal.        Behavior: Behavior normal.      ED Treatments / Results  Labs (all labs ordered are listed, but only abnormal results are displayed) Labs Reviewed  CBC WITH DIFFERENTIAL/PLATELET - Abnormal; Notable for the following components:      Result Value   WBC 3.9 (*)    Hemoglobin 11.9 (*)    MCH 25.3 (*)    All other components within normal limits  COMPREHENSIVE METABOLIC PANEL - Abnormal; Notable for the  following components:   Glucose, Bld 109 (*)    Calcium 8.8 (*)    All other components within normal limits  LIPASE, BLOOD  URINALYSIS, ROUTINE W REFLEX MICROSCOPIC  PREGNANCY, URINE    EKG None  Radiology Ct Abdomen Pelvis W Contrast  Result Date: 02/18/2019 CLINICAL DATA:  Abdominal pain, generalized EXAM: CT ABDOMEN AND PELVIS WITH CONTRAST TECHNIQUE: Multidetector CT imaging of the abdomen and pelvis was performed using the standard protocol following bolus administration of intravenous contrast. CONTRAST:  160mL OMNIPAQUE IOHEXOL 300 MG/ML  SOLN COMPARISON:  February 09, 2017 FINDINGS: Lower chest: Lung bases are clear. Hepatobiliary: No focal liver lesions are appreciable on this noncontrast enhanced study. Gallbladder absent. There is no appreciable biliary duct dilatation. Pancreas: There is no pancreatic mass or inflammatory focus. Spleen: No splenic lesions are evident. Adrenals/Urinary Tract: Adrenals bilaterally appear normal. There is no appreciable renal mass or hydronephrosis on either side. There is an extrarenal pelvis on each side, an anatomic variant, stable in appearance. There is a junctional parenchymal defect in each kidney, an anatomic variant. There is no evident renal or ureteral calculus on either side. Urinary bladder is midline with wall thickness within normal limits. Stomach/Bowel: There is no appreciable bowel wall or mesenteric thickening. There is a midline ventral hernia which contains a portion of the transverse colon. No bowel compromise is demonstrated on this study. There is no bowel obstruction evident. The terminal ileum appears unremarkable. No intramural air. There is no evident free air or portal venous air. Vascular/Lymphatic: There is no abdominal aortic aneurysm. No vascular lesions are apparent on this study. There is no adenopathy in the abdomen or pelvis. Reproductive: Uterus is anteverted.  No evident pelvic mass. Other: There is a midline ventral  hernia containing a loop of transverse colon without evident bowel compromise. The neck of the hernia measures 4.2 cm from right to left dimension and 3.4 cm from superior to inferior dimension. The hernia itself measures 8.4 cm from right to left dimension, 9.1 cm from anterior to posterior dimension, and 9.8 cm from superior to inferior dimension. Appendix appears normal. No evident abscess or ascites in the abdomen or pelvis. Musculoskeletal: No blastic or lytic bone lesions. No intramuscular lesions are evident. IMPRESSION: 1. Fairly sizable midline ventral hernia at the umbilicus containing a portion of transverse colon without bowel compromise. 2.  No bowel obstruction.  No intramural air. 3.  No abscess in the abdomen or pelvis.  Appendix appears normal. 4.  No evident renal or ureteral calculus. No hydronephrosis. Urinary bladder wall thickness normal. 5.  Gallbladder absent. Electronically Signed   By: Bretta Bang III M.D.   On: 02/18/2019 11:12    Procedures Procedures (including critical care time)  Medications Ordered in ED Medications  morphine 4 MG/ML injection 4 mg (4 mg Intravenous Given 02/18/19 0949)  ondansetron (ZOFRAN) injection 4 mg (4 mg Intravenous Given 02/18/19 0947)  sodium chloride 0.9 % bolus 1,000 mL (1,000 mLs Intravenous New Bag/Given 02/18/19 0947)  iohexol (OMNIPAQUE) 300 MG/ML solution 100 mL (100 mLs Intravenous Contrast Given 02/18/19 1037)     Initial Impression / Assessment and Plan / ED Course  I have reviewed the triage vital signs and the nursing notes.  Pertinent labs & imaging results that were available during my care of the patient were reviewed by me and considered in my medical decision making (see chart for details).       Pt is feeling much better.  Labs wnl.  CT with nothing acute.  She has a rather large ventral hernia which contains a portion of her transverse colon, but no bowel compromise.  She is instructed to f/u with surgery.  Return  if worse.  Final Clinical Impressions(s) / ED Diagnoses   Final diagnoses:  Ventral hernia without obstruction or gangrene    ED Discharge Orders         Ordered    HYDROcodone-acetaminophen (NORCO/VICODIN) 5-325 MG tablet  Every 4 hours PRN     02/18/19 1128    ondansetron (ZOFRAN ODT) 4 MG disintegrating tablet  Every 8 hours PRN     02/18/19 1128           Jacalyn Lefevre, MD 02/18/19 1130

## 2019-02-25 ENCOUNTER — Ambulatory Visit: Payer: Self-pay | Admitting: Surgery

## 2019-02-25 NOTE — H&P (Signed)
Surgical H&P  CC: hernia  HPI: This is a very nice 28 year old woman referred by the emergency department as a incarcerated incisional hernia. This developed following laparoscopic cholecystectomy in 2018. Initially it was reducible in the last year has become chronically incarcerated. She notes that it becomes firm and occasionally painful after meals. Typically this pain will last only 5 minutes, but last week she had significant long-lasting episode of pain prompted her to the emergency department. CT scan there confirmed chronically incarcerated hernia containing transverse colon, the hernia is about 3/2 by 4-1/2 cm in diameter. She denies any other abdominal surgeries besides the gallbladder and aside from severe obesity, and is otherwise healthy. She works as a Conservation officer, naturecashier. She does not smoke.  No Known Allergies  No past medical history on file.  No past surgical history on file.  No family history on file.  Social History   Socioeconomic History  . Marital status: Single    Spouse name: Not on file  . Number of children: Not on file  . Years of education: Not on file  . Highest education level: Not on file  Occupational History  . Not on file  Social Needs  . Financial resource strain: Not on file  . Food insecurity    Worry: Not on file    Inability: Not on file  . Transportation needs    Medical: Not on file    Non-medical: Not on file  Tobacco Use  . Smoking status: Never Smoker  . Smokeless tobacco: Never Used  Substance and Sexual Activity  . Alcohol use: No  . Drug use: No  . Sexual activity: Not on file  Lifestyle  . Physical activity    Days per week: Not on file    Minutes per session: Not on file  . Stress: Not on file  Relationships  . Social Musicianconnections    Talks on phone: Not on file    Gets together: Not on file    Attends religious service: Not on file    Active member of club or organization: Not on file    Attends meetings of clubs or  organizations: Not on file    Relationship status: Not on file  Other Topics Concern  . Not on file  Social History Narrative  . Not on file    Current Outpatient Medications on File Prior to Visit  Medication Sig Dispense Refill  . cetirizine (ZYRTEC) 10 MG tablet Take 1 tablet (10 mg total) by mouth daily. 30 tablet 0  . fluticasone (FLONASE) 50 MCG/ACT nasal spray Place 1 spray into both nostrils 2 (two) times daily. 16 g 0  . guaifenesin (ROBITUSSIN) 100 MG/5ML syrup Take 400 mg by mouth every 4 (four) hours as needed for cough.    Marland Kitchen. HYDROcodone-acetaminophen (NORCO/VICODIN) 5-325 MG tablet Take 1 tablet by mouth every 4 (four) hours as needed. 10 tablet 0  . magic mouthwash w/lidocaine SOLN Take 5 mLs by mouth 4 (four) times daily. 240 mL 0  . naproxen (NAPROSYN) 500 MG tablet Take 1 tablet (500 mg total) by mouth 2 (two) times daily. 20 tablet 0  . ondansetron (ZOFRAN ODT) 4 MG disintegrating tablet Take 1 tablet (4 mg total) by mouth every 8 (eight) hours as needed. 10 tablet 0   No current facility-administered medications on file prior to visit.     Review of Systems: a complete, 10pt review of systems was completed with pertinent positives and negatives as documented in the HPI  Physical Exam: Vitals  02/25/2019 2:48 PM Weight: 266.4 lb Height: 65in Body Surface Area: 2.23 m Body Mass Index: 44.33 kg/m  Temp.: 98.36F  Pulse: 100 (Regular)  BP: 138/84 (Sitting, Left Arm, Standard)   Gen: alert and well appearing Eye: extraocular motion intact, no scleral icterus ENT: moist mucus membranes, dentition intact Neck: no mass or thyromegaly Chest: unlabored respirations, symmetrical air entry, clear bilaterally CV: regular rate and rhythm, no pedal edema Abdomen: soft, nontender, nondistended. Partially reducible periumbilical hernia, no tenderness at this time MSK: strength symmetrical throughout, no deformity Neuro: grossly intact, normal gait Psych: normal  mood and affect, appropriate insight Skin: warm and dry, no rash or lesion on limited exam    CBC Latest Ref Rng & Units 02/18/2019 03/15/2014 03/29/2013  WBC 4.0 - 10.5 K/uL 3.9(L) 6.5 12.8(H)  Hemoglobin 12.0 - 15.0 g/dL 11.9(L) 11.0(L) 13.8  Hematocrit 36.0 - 46.0 % 39.1 35.7 40.9  Platelets 150 - 400 K/uL 261 292 269    CMP Latest Ref Rng & Units 02/18/2019 03/15/2014 03/29/2013  Glucose 70 - 99 mg/dL 109(H) 94 103(H)  BUN 6 - 20 mg/dL 13 7 6   Creatinine 0.44 - 1.00 mg/dL 0.66 0.80 0.82  Sodium 135 - 145 mmol/L 137 141 133(L)  Potassium 3.5 - 5.1 mmol/L 3.9 4.0 4.0  Chloride 98 - 111 mmol/L 102 110(H) 97  CO2 22 - 32 mmol/L 26 26 26   Calcium 8.9 - 10.3 mg/dL 8.8(L) 8.6 9.5  Total Protein 6.5 - 8.1 g/dL 7.6 7.7 8.3  Total Bilirubin 0.3 - 1.2 mg/dL 0.5 0.4 0.7  Alkaline Phos 38 - 126 U/L 62 100 86  AST 15 - 41 U/L 20 27 22   ALT 0 - 44 U/L 17 38 16    No results found for: INR, PROTIME  Imaging: No results found.   A/P: INCISIONAL HERNIA, INCARCERATED (K43.0) Story: Contains colon. Some GI symptoms as well as pain. We discussed laparoscopic hernia repair with mesh including risks of bleeding, infection, pain, scarring, injury to intra-abdominal structures, hematoma/seroma, and hernia recurrence. Her risk of wound problems and hernia recurrence is higher than average secondary to severe obesity with a BMI of 44. I advised her that ideally I would like her to be able to lose 55 or 60 pounds to reach a BMI less than 35, however given that there is bowel in the hernia and increasing severity and frequency of symptoms, we discussed small risk of bowel strangulation which would necessitate emergency surgery and discussed that repair is warranted. She understands that her risk is increased but wishes to proceed with surgery.   Romana Juniper, MD [facility], [place] Pager [phone]

## 2019-04-25 NOTE — Patient Instructions (Addendum)
DUE TO COVID-19 ONLY ONE VISITOR IS ALLOWED TO COME WITH YOU AND STAY IN THE WAITING ROOM ONLY DURING PRE OP AND PROCEDURE DAY OF SURGERY. THE 1 VISITOR MAY VISIT WITH YOU AFTER SURGERY IN YOUR PRIVATE ROOM DURING VISITING HOURS ONLY!  ONCE YOUR COVID TEST IS COMPLETED, PLEASE BEGIN THE QUARANTINE INSTRUCTIONS AS OUTLINED IN YOUR HANDOUT.                Yesenia Andrade    Your procedure is scheduled on: 04/29/19   Report to [facility] Main  Entrance   Report to admitting at  8:00 AM     Call this number if you have problems the morning of surgery [phone]    Remember: Do not eat food or drink liquids :After Midnight.   BRUSH YOUR TEETH MORNING OF SURGERY AND RINSE YOUR MOUTH OUT, NO CHEWING GUM CANDY OR MINTS.     Take these medicines the morning of surgery with A SIP OF WATER: none                                 You may not have any metal on your body including hair pins and              piercings  Do not wear jewelry, make-up, lotions, powders or perfumes, deodorant             Do not wear nail polish on your fingernails.  Do not shave  48 hours prior to surgery.     Do not bring valuables to the hospital. [place].  Contacts, dentures or bridgework may not be worn into surgery.       Special Instructions: N/A              Please read over the following fact sheets you were given: _____________________________________________________________________             [facility] - Preparing for Surgery  Before surgery, you can play an important role.   Because skin is not sterile, your skin needs to be as free of germs as possible.   You can reduce the number of germs on your skin by washing with CHG (chlorahexidine gluconate) soap before surgery.   CHG is an antiseptic cleaner which kills germs and bonds with the skin to continue killing germs even after washing. Please DO NOT use if you have an allergy  to CHG or antibacterial soaps.   If your skin becomes reddened/irritated stop using the CHG and inform your nurse when you arrive at Short Stay. Do not shave (including legs and underarms) for at least 48 hours prior to the first CHG shower.    Please follow these instructions carefully:  1.  Shower with CHG Soap the night before surgery and the  morning of Surgery.  2.  If you choose to wash your hair, wash your hair first as usual with your  normal  shampoo.  3.  After you shampoo, rinse your hair and body thoroughly to remove the  shampoo.                                        4.  Use CHG as you would  any other liquid soap.  You can apply chg directly  to the skin and wash                       Gently with a scrungie or clean washcloth.  5.  Apply the CHG Soap to your body ONLY FROM THE NECK DOWN.   Do not use on face/ open                           Wound or open sores. Avoid contact with eyes, ears mouth and genitals (private parts).                       Wash face,  Genitals (private parts) with your normal soap.             6.  Wash thoroughly, paying special attention to the area where your surgery  will be performed.  7.  Thoroughly rinse your body with warm water from the neck down.  8.  DO NOT shower/wash with your normal soap after using and rinsing off  the CHG Soap.             9.  Pat yourself dry with a clean towel.            10.  Wear clean pajamas.            11.  Place clean sheets on your bed the night of your first shower and do not  sleep with pets. Day of Surgery : Do not apply any lotions/deodorants the morning of surgery.  Please wear clean clothes to the hospital/surgery center.  FAILURE TO FOLLOW THESE INSTRUCTIONS MAY RESULT IN THE CANCELLATION OF YOUR SURGERY PATIENT SIGNATURE_________________________________  NURSE SIGNATURE__________________________________  ________________________________________________________________________

## 2019-04-26 ENCOUNTER — Other Ambulatory Visit (HOSPITAL_COMMUNITY)
Admission: RE | Admit: 2019-04-26 | Discharge: 2019-04-26 | Disposition: A | Discharge status: Home / Self Care | Payer: Medicaid Other | Source: Ambulatory Visit | Attending: Surgery | Admitting: Surgery

## 2019-04-26 DIAGNOSIS — Z01812 Encounter for preprocedural laboratory examination: Secondary | ICD-10-CM | POA: Diagnosis present

## 2019-04-26 DIAGNOSIS — Z20828 Contact with and (suspected) exposure to other viral communicable diseases: Secondary | ICD-10-CM | POA: Diagnosis not present

## 2019-04-27 ENCOUNTER — Other Ambulatory Visit: Payer: Self-pay

## 2019-04-27 ENCOUNTER — Encounter (HOSPITAL_COMMUNITY)
Admission: RE | Admit: 2019-04-27 | Discharge: 2019-04-27 | Disposition: A | Discharge status: Home / Self Care | Payer: Medicaid Other | Source: Ambulatory Visit | Attending: Surgery | Admitting: Surgery

## 2019-04-27 ENCOUNTER — Encounter (HOSPITAL_COMMUNITY): Payer: Self-pay

## 2019-04-27 DIAGNOSIS — Z01812 Encounter for preprocedural laboratory examination: Secondary | ICD-10-CM | POA: Diagnosis not present

## 2019-04-27 DIAGNOSIS — K430 Incisional hernia with obstruction, without gangrene: Secondary | ICD-10-CM | POA: Insufficient documentation

## 2019-04-27 HISTORY — DX: Gastro-esophageal reflux disease without esophagitis: K21.9

## 2019-04-27 LAB — CBC
HCT: 44.9 % (ref 36.0–46.0)
Hemoglobin: 13.3 g/dL (ref 12.0–15.0)
MCH: 25.1 pg — ABNORMAL LOW (ref 26.0–34.0)
MCHC: 29.6 g/dL — ABNORMAL LOW (ref 30.0–36.0)
MCV: 84.7 fL (ref 80.0–100.0)
Platelets: 265 10*3/uL (ref 150–400)
RBC: 5.3 MIL/uL — ABNORMAL HIGH (ref 3.87–5.11)
RDW: 14.7 % (ref 11.5–15.5)
WBC: 2.8 10*3/uL — ABNORMAL LOW (ref 4.0–10.5)
nRBC: 0 % (ref 0.0–0.2)

## 2019-04-27 LAB — NOVEL CORONAVIRUS, NAA (HOSP ORDER, SEND-OUT TO REF LAB; TAT 18-24 HRS): SARS-CoV-2, NAA: NOT DETECTED

## 2019-04-27 NOTE — Progress Notes (Signed)
PCP - Dr. Felipe Drone Cardiologist - no  Chest x-ray - no EKG - no Stress Test - no ECHO - no Cardiac Cath -no   Sleep Study - no CPAP -   Fasting Blood Sugar - NA Checks Blood Sugar _____ times a day  Blood Thinner Instructions: NA Aspirin Instructions: Last Dose:  Anesthesia review:   Patient denies shortness of breath, fever, cough and chest pain at PAT appointment yes  Patient verbalized understanding of instructions that were given to them at the PAT appointment. Patient was also instructed that they will need to review over the PAT instructions again at home before surgery. Yes Pt has lost about 40 #s in prep of surgery.

## 2019-04-28 MED ORDER — BUPIVACAINE LIPOSOME 1.3 % IJ SUSP
20.0000 mL | INTRAMUSCULAR | Status: DC
  Filled 2019-04-28: qty 20

## 2019-04-28 MED ORDER — DEXTROSE 5 % IV SOLN
3.0000 g | INTRAVENOUS | Status: AC
  Administered 2019-04-29: 10:00:00 3 g via INTRAVENOUS
  Filled 2019-04-28: qty 3

## 2019-04-29 ENCOUNTER — Ambulatory Visit (HOSPITAL_COMMUNITY): Payer: Medicaid Other | Admitting: Certified Registered"

## 2019-04-29 ENCOUNTER — Encounter (HOSPITAL_COMMUNITY): Payer: Self-pay | Admitting: *Deleted

## 2019-04-29 ENCOUNTER — Other Ambulatory Visit: Payer: Self-pay

## 2019-04-29 ENCOUNTER — Encounter (HOSPITAL_COMMUNITY)
Admission: RE | Disposition: A | Discharge status: Home / Self Care | Payer: Self-pay | Source: Home / Self Care | Attending: Surgery

## 2019-04-29 ENCOUNTER — Ambulatory Visit (HOSPITAL_COMMUNITY)
Admission: RE | Admit: 2019-04-29 | Discharge: 2019-04-29 | Disposition: A | Discharge status: Home / Self Care | Payer: Medicaid Other | Attending: Surgery | Admitting: Surgery

## 2019-04-29 DIAGNOSIS — K430 Incisional hernia with obstruction, without gangrene: Secondary | ICD-10-CM | POA: Diagnosis not present

## 2019-04-29 DIAGNOSIS — Z6841 Body Mass Index (BMI) 40.0 and over, adult: Secondary | ICD-10-CM | POA: Insufficient documentation

## 2019-04-29 HISTORY — PX: INCISIONAL HERNIA REPAIR: SHX193

## 2019-04-29 LAB — PREGNANCY, URINE: Preg Test, Ur: NEGATIVE

## 2019-04-29 SURGERY — REPAIR, HERNIA, INCISIONAL, LAPAROSCOPIC
Anesthesia: General

## 2019-04-29 MED ORDER — LACTATED RINGERS IV SOLN
INTRAVENOUS | Status: DC
  Administered 2019-04-29 (×2): via INTRAVENOUS

## 2019-04-29 MED ORDER — KETOROLAC TROMETHAMINE 30 MG/ML IJ SOLN
INTRAMUSCULAR | Status: AC
  Filled 2019-04-29: qty 1

## 2019-04-29 MED ORDER — CHLORHEXIDINE GLUCONATE 4 % EX LIQD: 60.0000 mL | Freq: Once | CUTANEOUS | Status: DC

## 2019-04-29 MED ORDER — LIDOCAINE HCL (CARDIAC) PF 100 MG/5ML IV SOSY: PREFILLED_SYRINGE | INTRAVENOUS | Status: DC | PRN

## 2019-04-29 MED ORDER — OXYCODONE HCL 5 MG/5ML PO SOLN: 5.0000 mg | Freq: Once | ORAL | Status: AC | PRN

## 2019-04-29 MED ORDER — ACETAMINOPHEN 500 MG PO TABS: 1000.0000 mg | ORAL_TABLET | Freq: Four times a day (QID) | ORAL | 2 refills | Status: AC | PRN

## 2019-04-29 MED ORDER — DEXAMETHASONE SODIUM PHOSPHATE 10 MG/ML IJ SOLN
INTRAMUSCULAR | Status: DC | PRN
  Administered 2019-04-29: 8 mg via INTRAVENOUS

## 2019-04-29 MED ORDER — LIDOCAINE 2% (20 MG/ML) 5 ML SYRINGE
INTRAMUSCULAR | Status: AC
  Filled 2019-04-29: qty 5

## 2019-04-29 MED ORDER — ROCURONIUM BROMIDE 10 MG/ML (PF) SYRINGE
PREFILLED_SYRINGE | INTRAVENOUS | Status: AC
  Filled 2019-04-29: qty 10

## 2019-04-29 MED ORDER — PROPOFOL 10 MG/ML IV BOLUS
INTRAVENOUS | Status: DC | PRN
  Administered 2019-04-29: 200 mg via INTRAVENOUS

## 2019-04-29 MED ORDER — SODIUM CHLORIDE 0.9% FLUSH: 3.0000 mL | INTRAVENOUS | Status: DC | PRN

## 2019-04-29 MED ORDER — SODIUM CHLORIDE 0.9 % IV SOLN: 250.0000 mL | INTRAVENOUS | Status: DC | PRN

## 2019-04-29 MED ORDER — SUGAMMADEX SODIUM 200 MG/2ML IV SOLN
INTRAVENOUS | Status: DC | PRN
  Administered 2019-04-29: 220 mg via INTRAVENOUS

## 2019-04-29 MED ORDER — SUCCINYLCHOLINE CHLORIDE 20 MG/ML IJ SOLN
INTRAMUSCULAR | Status: DC | PRN
  Administered 2019-04-29: 100 mg via INTRAVENOUS

## 2019-04-29 MED ORDER — OXYCODONE HCL 5 MG PO TABS: 5.0000 mg | ORAL_TABLET | ORAL | Status: DC | PRN

## 2019-04-29 MED ORDER — ROCURONIUM BROMIDE 10 MG/ML (PF) SYRINGE
PREFILLED_SYRINGE | INTRAVENOUS | Status: DC | PRN
  Administered 2019-04-29: 20 mg via INTRAVENOUS
  Administered 2019-04-29: 40 mg via INTRAVENOUS
  Administered 2019-04-29 (×3): 10 mg via INTRAVENOUS

## 2019-04-29 MED ORDER — MIDAZOLAM HCL 2 MG/2ML IJ SOLN
INTRAMUSCULAR | Status: AC
  Filled 2019-04-29: qty 2

## 2019-04-29 MED ORDER — OXYCODONE HCL 5 MG PO TABS: 5.0000 mg | ORAL_TABLET | Freq: Four times a day (QID) | ORAL | 0 refills | Status: AC | PRN

## 2019-04-29 MED ORDER — PHENYLEPHRINE 40 MCG/ML (10ML) SYRINGE FOR IV PUSH (FOR BLOOD PRESSURE SUPPORT)
PREFILLED_SYRINGE | INTRAVENOUS | Status: AC
  Filled 2019-04-29: qty 10

## 2019-04-29 MED ORDER — SUCCINYLCHOLINE CHLORIDE 200 MG/10ML IV SOSY
PREFILLED_SYRINGE | INTRAVENOUS | Status: AC
  Filled 2019-04-29: qty 10

## 2019-04-29 MED ORDER — DEXAMETHASONE SODIUM PHOSPHATE 10 MG/ML IJ SOLN
INTRAMUSCULAR | Status: AC
  Filled 2019-04-29: qty 1

## 2019-04-29 MED ORDER — ONDANSETRON HCL 4 MG/2ML IJ SOLN
INTRAMUSCULAR | Status: AC
  Filled 2019-04-29: qty 2

## 2019-04-29 MED ORDER — SODIUM CHLORIDE 0.9% FLUSH: 3.0000 mL | Freq: Two times a day (BID) | INTRAVENOUS | Status: DC

## 2019-04-29 MED ORDER — GABAPENTIN 300 MG PO CAPS: 300.0000 mg | ORAL_CAPSULE | Freq: Three times a day (TID) | ORAL | 0 refills | Status: DC

## 2019-04-29 MED ORDER — PROMETHAZINE HCL 25 MG/ML IJ SOLN: 6.2500 mg | INTRAMUSCULAR | Status: DC | PRN

## 2019-04-29 MED ORDER — MEPERIDINE HCL 50 MG/ML IJ SOLN: 6.2500 mg | INTRAMUSCULAR | Status: DC | PRN

## 2019-04-29 MED ORDER — ACETAMINOPHEN 650 MG RE SUPP
650.0000 mg | RECTAL | Status: DC | PRN
  Filled 2019-04-29: qty 1

## 2019-04-29 MED ORDER — OXYCODONE HCL 5 MG PO TABS
ORAL_TABLET | ORAL | Status: AC
  Filled 2019-04-29: qty 1

## 2019-04-29 MED ORDER — OXYCODONE HCL 5 MG PO TABS
5.0000 mg | ORAL_TABLET | Freq: Once | ORAL | Status: AC | PRN
  Administered 2019-04-29: 14:00:00 5 mg via ORAL

## 2019-04-29 MED ORDER — LIDOCAINE 2% (20 MG/ML) 5 ML SYRINGE
INTRAMUSCULAR | Status: DC | PRN
  Administered 2019-04-29: 40 mg via INTRAVENOUS

## 2019-04-29 MED ORDER — HYDROMORPHONE HCL 1 MG/ML IJ SOLN
INTRAMUSCULAR | Status: AC
  Filled 2019-04-29: qty 1

## 2019-04-29 MED ORDER — BUPIVACAINE HCL (PF) 0.25 % IJ SOLN
INTRAMUSCULAR | Status: AC
  Filled 2019-04-29: qty 30

## 2019-04-29 MED ORDER — SCOPOLAMINE 1 MG/3DAYS TD PT72
1.0000 | MEDICATED_PATCH | TRANSDERMAL | Status: DC
  Administered 2019-04-29: 09:00:00 1.5 mg via TRANSDERMAL

## 2019-04-29 MED ORDER — IBUPROFEN 200 MG PO TABS: 200.0000 mg | ORAL_TABLET | Freq: Four times a day (QID) | ORAL | 2 refills | Status: AC | PRN

## 2019-04-29 MED ORDER — FENTANYL CITRATE (PF) 100 MCG/2ML IJ SOLN
INTRAMUSCULAR | Status: DC | PRN
  Administered 2019-04-29: 50 ug via INTRAVENOUS
  Administered 2019-04-29: 100 ug via INTRAVENOUS

## 2019-04-29 MED ORDER — BUPIVACAINE LIPOSOME 1.3 % IJ SUSP
INTRAMUSCULAR | Status: DC | PRN
  Administered 2019-04-29: 20 mL

## 2019-04-29 MED ORDER — FENTANYL CITRATE (PF) 100 MCG/2ML IJ SOLN: 25.0000 ug | INTRAMUSCULAR | Status: DC | PRN

## 2019-04-29 MED ORDER — BUPIVACAINE-EPINEPHRINE 0.25% -1:200000 IJ SOLN
INTRAMUSCULAR | Status: DC | PRN
  Administered 2019-04-29: 30 mL

## 2019-04-29 MED ORDER — ONDANSETRON HCL 4 MG/2ML IJ SOLN
INTRAMUSCULAR | Status: DC | PRN
  Administered 2019-04-29: 4 mg via INTRAVENOUS

## 2019-04-29 MED ORDER — SUGAMMADEX SODIUM 500 MG/5ML IV SOLN
INTRAVENOUS | Status: AC
  Filled 2019-04-29: qty 5

## 2019-04-29 MED ORDER — DOCUSATE SODIUM 100 MG PO CAPS: 100.0000 mg | ORAL_CAPSULE | Freq: Two times a day (BID) | ORAL | 0 refills | Status: AC

## 2019-04-29 MED ORDER — ACETAMINOPHEN 500 MG PO TABS
1000.0000 mg | ORAL_TABLET | ORAL | Status: AC
  Administered 2019-04-29: 1000 mg via ORAL
  Filled 2019-04-29: qty 2

## 2019-04-29 MED ORDER — PHENYLEPHRINE 40 MCG/ML (10ML) SYRINGE FOR IV PUSH (FOR BLOOD PRESSURE SUPPORT)
PREFILLED_SYRINGE | INTRAVENOUS | Status: DC | PRN
  Administered 2019-04-29: 160 ug via INTRAVENOUS
  Administered 2019-04-29: 80 ug via INTRAVENOUS

## 2019-04-29 MED ORDER — PROPOFOL 10 MG/ML IV BOLUS
INTRAVENOUS | Status: AC
  Filled 2019-04-29: qty 20

## 2019-04-29 MED ORDER — KETOROLAC TROMETHAMINE 30 MG/ML IJ SOLN
30.0000 mg | Freq: Once | INTRAMUSCULAR | Status: AC | PRN
  Administered 2019-04-29: 30 mg via INTRAVENOUS

## 2019-04-29 MED ORDER — HYDROMORPHONE HCL 1 MG/ML IJ SOLN
0.2500 mg | INTRAMUSCULAR | Status: DC | PRN
  Administered 2019-04-29 (×2): 0.25 mg via INTRAVENOUS

## 2019-04-29 MED ORDER — FENTANYL CITRATE (PF) 100 MCG/2ML IJ SOLN
INTRAMUSCULAR | Status: AC
  Filled 2019-04-29: qty 2

## 2019-04-29 MED ORDER — SCOPOLAMINE 1 MG/3DAYS TD PT72
MEDICATED_PATCH | TRANSDERMAL | Status: AC
  Administered 2019-04-29: 1.5 mg via TRANSDERMAL
  Filled 2019-04-29: qty 1

## 2019-04-29 MED ORDER — ACETAMINOPHEN 500 MG PO TABS: 1000.0000 mg | ORAL_TABLET | Freq: Once | ORAL | Status: DC

## 2019-04-29 MED ORDER — METHOCARBAMOL 500 MG PO TABS: 500.0000 mg | ORAL_TABLET | Freq: Four times a day (QID) | ORAL | 0 refills | Status: DC | PRN

## 2019-04-29 MED ORDER — MIDAZOLAM HCL 2 MG/2ML IJ SOLN
INTRAMUSCULAR | Status: DC | PRN
  Administered 2019-04-29: 2 mg via INTRAVENOUS

## 2019-04-29 MED ORDER — ACETAMINOPHEN 325 MG PO TABS: 650.0000 mg | ORAL_TABLET | ORAL | Status: DC | PRN

## 2019-04-29 MED ORDER — GABAPENTIN 300 MG PO CAPS
300.0000 mg | ORAL_CAPSULE | ORAL | Status: AC
  Administered 2019-04-29: 09:00:00 300 mg via ORAL
  Filled 2019-04-29: qty 1

## 2019-04-29 SURGICAL SUPPLY — 39 items
BINDER ABDOMINAL 12 ML 46-62 (SOFTGOODS) ×2 IMPLANT
CABLE HIGH FREQUENCY MONO STRZ (ELECTRODE) ×1 IMPLANT
CHLORAPREP W/TINT 26 (MISCELLANEOUS) ×3 IMPLANT
CLOSURE WOUND 1/2 X4 (GAUZE/BANDAGES/DRESSINGS)
COTTON BALL STERILE (GAUZE/BANDAGES/DRESSINGS) ×3
COTTON BALL STERILE 2 PK (GAUZE/BANDAGES/DRESSINGS) IMPLANT
COVER SURGICAL LIGHT HANDLE (MISCELLANEOUS) ×3 IMPLANT
COVER WAND RF STERILE (DRAPES) IMPLANT
DECANTER SPIKE VIAL GLASS SM (MISCELLANEOUS) ×2 IMPLANT
DERMABOND ADVANCED (GAUZE/BANDAGES/DRESSINGS)
DERMABOND ADVANCED .7 DNX12 (GAUZE/BANDAGES/DRESSINGS) ×1 IMPLANT
DEVICE SECURE STRAP 25 ABSORB (INSTRUMENTS) ×4 IMPLANT
DRSG TEGADERM 4X4.75 (GAUZE/BANDAGES/DRESSINGS) ×2 IMPLANT
ELECT REM PT RETURN 15FT ADLT (MISCELLANEOUS) ×3 IMPLANT
GLOVE BIO SURGEON STRL SZ 6 (GLOVE) ×13 IMPLANT
GLOVE INDICATOR 6.5 STRL GRN (GLOVE) ×3 IMPLANT
GOWN STRL REUS W/TWL LRG LVL3 (GOWN DISPOSABLE) ×3 IMPLANT
GOWN STRL REUS W/TWL XL LVL3 (GOWN DISPOSABLE) ×6 IMPLANT
GRASPER SUT TROCAR 14GX15 (MISCELLANEOUS) ×3 IMPLANT
KIT BASIN OR (CUSTOM PROCEDURE TRAY) ×3 IMPLANT
KIT TURNOVER KIT A (KITS) ×2 IMPLANT
MARKER SKIN DUAL TIP RULER LAB (MISCELLANEOUS) ×1 IMPLANT
MESH VENTRALIGHT ST 6X8 (Mesh Specialty) ×2 IMPLANT
MESH VENTRLGHT ELLIPSE 8X6XMFL (Mesh Specialty) IMPLANT
PENCIL SMOKE EVACUATOR (MISCELLANEOUS) IMPLANT
SCISSORS LAP 5X35 DISP (ENDOMECHANICALS) ×3 IMPLANT
SET IRRIG TUBING LAPAROSCOPIC (IRRIGATION / IRRIGATOR) ×2 IMPLANT
SET TUBE SMOKE EVAC HIGH FLOW (TUBING) ×3 IMPLANT
SHEARS HARMONIC ACE PLUS 36CM (ENDOMECHANICALS) ×2 IMPLANT
SLEEVE XCEL OPT CAN 5 100 (ENDOMECHANICALS) ×3 IMPLANT
STRIP CLOSURE SKIN 1/2X4 (GAUZE/BANDAGES/DRESSINGS) ×1 IMPLANT
SUT ETHIBOND 0 (SUTURE) ×6 IMPLANT
SUT MNCRL AB 4-0 PS2 18 (SUTURE) ×5 IMPLANT
SUT NOVA NAB DX-16 0-1 5-0 T12 (SUTURE) ×4 IMPLANT
TAPE STRIPS DRAPE STRL (GAUZE/BANDAGES/DRESSINGS) ×2 IMPLANT
TOWEL OR 17X26 10 PK STRL BLUE (TOWEL DISPOSABLE) ×3 IMPLANT
TRAY LAPAROSCOPIC (CUSTOM PROCEDURE TRAY) ×3 IMPLANT
TROCAR BLADELESS OPT 5 100 (ENDOMECHANICALS) ×3 IMPLANT
TROCAR XCEL 12X100 BLDLESS (ENDOMECHANICALS) ×3 IMPLANT

## 2019-04-29 NOTE — Anesthesia Procedure Notes (Signed)
Procedure Name: Intubation Date/Time: 04/29/2019 10:24 AM Performed by: Niel Hummer, CRNA Pre-anesthesia Checklist: Patient identified, Emergency Drugs available, Suction available and Patient being monitored Patient Re-evaluated:Patient Re-evaluated prior to induction Oxygen Delivery Method: Circle system utilized Preoxygenation: Pre-oxygenation with 100% oxygen Induction Type: IV induction Laryngoscope Size: Mac and 4 Grade View: Grade I Tube type: Oral Tube size: 7.0 mm Number of attempts: 1 Airway Equipment and Method: Stylet Placement Confirmation: ETT inserted through vocal cords under direct vision,  positive ETCO2 and breath sounds checked- equal and bilateral Secured at: 21 cm Tube secured with: Tape Dental Injury: Teeth and Oropharynx as per pre-operative assessment

## 2019-04-29 NOTE — Discharge Instructions (Signed)
HERNIA REPAIR: POST OP INSTRUCTIONS  ######################################################################  EAT Gradually transition to a high fiber diet with a fiber supplement over the next few weeks after discharge.  Start with a pureed / full liquid diet (see below)  WALK Walk an hour a day.  Control your pain to do that.    CONTROL PAIN Control pain so that you can walk, sleep, tolerate sneezing/coughing, and go up/down stairs.  HAVE A BOWEL MOVEMENT DAILY Keep your bowels regular to avoid problems.  OK to try a laxative to override constipation.  OK to use an antidairrheal to slow down diarrhea.  Call if not better after 2 tries  CALL IF YOU HAVE PROBLEMS/CONCERNS Call if you are still struggling despite following these instructions. Call if you have concerns not answered by these instructions  ######################################################################    1. DIET: Follow a light bland diet & liquids the first 24 hours after arrival home, such as soup, liquids, starches, etc.  Be sure to drink plenty of fluids.  Quickly advance to a usual solid diet within a few days.  Avoid fast food or heavy meals as your are more likely to get nauseated or have irregular bowels.  A low-sugar, high-fiber diet for the rest of your life is ideal.   2. Take your usually prescribed home medications unless otherwise directed.  3. PAIN CONTROL: a. Pain is best controlled by a usual combination of three different methods TOGETHER: i. Ice/Heat ii. Over the counter pain medication iii. Prescription pain medication b. Most patients will experience some swelling and bruising around the hernia(s) such as the bellybutton, groins, or old incisions.  Ice packs or heating pads (30-60 minutes up to 6 times a day) will help. Use ice for the first few days to help decrease swelling and bruising, then switch to heat to help relax tight/sore spots and speed recovery.  Some people prefer to use ice  alone, heat alone, alternating between ice & heat.  Experiment to what works for you.  Swelling and bruising can take several weeks to resolve.   c. It is helpful to take an over-the-counter pain medication regularly for the first few days, then transition to taking them as needed.   i. Ibuprofen (Advil, etc) Two or Three 200mg  tabs four times a day (every meal & bedtime) ii. Acetaminophen (Tylenol, etc) 325-650mg  four times a day (every meal & bedtime) d. Prescriptions for pain medication should be given to you upon discharge (Robaxin, Gabapentin, and oxycodone).  Take your pain medication as prescribed.  i. If you are having problems/concerns with the prescription medicine (does not control pain, nausea, vomiting, rash, itching, etc), please call us [phone] to see if we need to switch you to a different pain medicine that will work better for you and/or control your side effect better. ii. If you need a refill on your pain medication, please contact your pharmacy.  They will contact our office to request authorization. Prescriptions will not be filled after 5 pm or on week-ends.  4. Avoid getting constipated.  Between the surgery and the pain medications, it is common to experience some constipation.  Increasing fluid intake and taking a fiber supplement (such as Metamucil, Citrucel, FiberCon, MiraLax, etc) 1-2 times a day regularly will usually help prevent this problem from occurring.  A mild laxative (prune juice, Milk of Magnesia, MiraLax, etc) should be taken according to package directions if there are no bowel movements after 48 hours.    5. Wash / shower every  day, starting 2 days after surgery.  You may shower over the steri strips as they are waterproof.    6. Remove bandaids and cotton ball dressing 2 days after surgery. Steri strips (white strips) will stay on for at least a week. You may replace a dressing/Band-Aid to cover the incisions for comfort if you wish. You may leave the  incisions open to air.  You may replace a dressing/Band-Aid to cover an incision for comfort if you wish.  Continue to shower over incision(s) after the dressing is off. 7. WEAR ABDOMINAL BINDER AT ALL TIMES FOR THE FIRST WEEK AFTER SURGERY. Then wear just when up and moving around until follow up in the office.  8. ACTIVITIES as tolerated:   a. You may resume regular (light) daily activities beginning the next day--such as daily self-care, walking, climbing stairs--gradually increasing activities as tolerated.  Control your pain so that you can walk an hour a day.  If you can walk 30 minutes without difficulty, it is safe to try more intense activity such as jogging, treadmill, bicycling, low-impact aerobics, swimming, etc. b. Refrain from the most intensive and strenuous activity such as sit-ups, heavy lifting, contact sports, etc  Refrain from any heavy lifting or straining until at least 6 weeks after surgery.    c. DO NOT PUSH THROUGH PAIN.  Let pain be your guide: If it hurts to do something, don't do it.  Pain is your body warning you to avoid that activity for another week until the pain goes down. d. You may drive when you are no longer taking prescription pain medication, you can comfortably wear a seatbelt, and you can safely maneuver your car and apply brakes. e. Bonita Quin may have sexual intercourse when it is comfortable.   9. FOLLOW UP in our office a. Please call [facility] at [phone] to set up an appointment to see your surgeon in the office for a follow-up appointment approximately 2-3 weeks after your surgery. b. Make sure that you call for this appointment the day you arrive home to insure a convenient appointment time.  9.  If you have disability of FMLA / Family leave forms, please bring the forms to the office for processing.  (do not give to your surgeon).  WHEN TO CALL us [phone]: 1. Poor pain control 2. Reactions / problems with new medications (rash/itching, nausea, etc)   3. Fever over 101.5 F (38.5 C) 4. Inability to urinate 5. Nausea and/or vomiting 6. Worsening swelling or bruising 7. Continued bleeding from incision. 8. Increased pain, redness, or drainage from the incision   The clinic staff is available to answer your questions during regular business hours (8:30am-5pm).  Please dont hesitate to call and ask to speak to one of our nurses for clinical concerns.   If you have a medical emergency, go to the nearest emergency room or call 911.  A surgeon from [facility] is always on call at the hospitals in [facility], [place] [address], [address], [place], [place]  [address] ?  [address] [address], [place], [place]   [address] MAIN: [phone] ? TOLL FREE: [phone] ? FAX: [phone] [email or web address]

## 2019-04-29 NOTE — H&P (Signed)
Surgical H&P  CC: hernia  HPI: This is a very nice 28 year old woman referred by the emergency department as a incarcerated incisional hernia. This developed following laparoscopic cholecystectomy in 2018. Initially it was reducible in the last year has become chronically incarcerated. She notes that it becomes firm and occasionally painful after meals. Typically this pain will last only 5 minutes, but last week she had significant long-lasting episode of pain prompted her to the emergency department. CT scan there confirmed chronically incarcerated hernia containing transverse colon, the hernia is about 3/2 by 4-1/2 cm in diameter. She denies any other abdominal surgeries besides the gallbladder and aside from severe obesity, and is otherwise healthy. She works as a Scientist, water quality. She does not smoke.  No Known Allergies  No past medical history on file.  No past surgical history on file.  No family history on file.  Social History        Socioeconomic History  . Marital status: Single    Spouse name: Not on file  . Number of children: Not on file  . Years of education: Not on file  . Highest education level: Not on file  Occupational History  . Not on file  Social Needs  . Financial resource strain: Not on file  . Food insecurity    Worry: Not on file    Inability: Not on file  . Transportation needs    Medical: Not on file    Non-medical: Not on file  Tobacco Use  . Smoking status: Never Smoker  . Smokeless tobacco: Never Used  Substance and Sexual Activity  . Alcohol use: No  . Drug use: No  . Sexual activity: Not on file  Lifestyle  . Physical activity    Days per week: Not on file    Minutes per session: Not on file  . Stress: Not on file  Relationships  . Social Herbalist on phone: Not on file    Gets together: Not on file    Attends religious service: Not on file    Active member of club or organization: Not on file     Attends meetings of clubs or organizations: Not on file    Relationship status: Not on file  Other Topics Concern  . Not on file  Social History Narrative  . Not on file          Current Outpatient Medications on File Prior to Visit  Medication Sig Dispense Refill  . cetirizine (ZYRTEC) 10 MG tablet Take 1 tablet (10 mg total) by mouth daily. 30 tablet 0  . fluticasone (FLONASE) 50 MCG/ACT nasal spray Place 1 spray into both nostrils 2 (two) times daily. 16 g 0  . guaifenesin (ROBITUSSIN) 100 MG/5ML syrup Take 400 mg by mouth every 4 (four) hours as needed for cough.    Marland Kitchen HYDROcodone-acetaminophen (NORCO/VICODIN) 5-325 MG tablet Take 1 tablet by mouth every 4 (four) hours as needed. 10 tablet 0  . magic mouthwash w/lidocaine SOLN Take 5 mLs by mouth 4 (four) times daily. 240 mL 0  . naproxen (NAPROSYN) 500 MG tablet Take 1 tablet (500 mg total) by mouth 2 (two) times daily. 20 tablet 0  . ondansetron (ZOFRAN ODT) 4 MG disintegrating tablet Take 1 tablet (4 mg total) by mouth every 8 (eight) hours as needed. 10 tablet 0   No current facility-administered medications on file prior to visit.     Review of Systems: a complete, 10pt review of systems was completed  with pertinent positives and negatives as documented in the HPI  Physical Exam: Vitals  02/25/2019 2:48 PM Weight: 266.4 lb Height: 65in Body Surface Area: 2.23 m Body Mass Index: 44.33 kg/m  Temp.: 98.15F  Pulse: 100 (Regular)  BP: 138/84 (Sitting, Left Arm, Standard)   Gen: alert and well appearing Eye: extraocular motion intact, no scleral icterus ENT: moist mucus membranes, dentition intact Neck: no mass or thyromegaly Chest: unlabored respirations, symmetrical air entry, clear bilaterally CV: regular rate and rhythm, no pedal edema Abdomen: soft, nontender, nondistended. Partially reducible periumbilical hernia, no tenderness at this time MSK: strength symmetrical throughout, no  deformity Neuro: grossly intact, normal gait Psych: normal mood and affect, appropriate insight Skin: warm and dry, no rash or lesion on limited exam    CBC Latest Ref Rng & Units 02/18/2019 03/15/2014 03/29/2013  WBC 4.0 - 10.5 K/uL 3.9(L) 6.5 12.8(H)  Hemoglobin 12.0 - 15.0 g/dL 11.9(L) 11.0(L) 13.8  Hematocrit 36.0 - 46.0 % 39.1 35.7 40.9  Platelets 150 - 400 K/uL 261 292 269    CMP Latest Ref Rng & Units 02/18/2019 03/15/2014 03/29/2013  Glucose 70 - 99 mg/dL 185(U) 94 314(H)  BUN 6 - 20 mg/dL 13 7 6   Creatinine 0.44 - 1.00 mg/dL 7.02 6.37  Sodium 135 - 145 mmol/L 137 141 133(L)  Potassium 3.5 - 5.1 mmol/L 3.9 4.0 4.0  Chloride 98 - 111 mmol/L 102 110(H) 97  CO2 22 - 32 mmol/L 26 26 26   Calcium 8.9 - 10.3 mg/dL 8.58) 8.6 9.5  Total Protein 6.5 - 8.1 g/dL 7.6 7.7 8.3  Total Bilirubin 0.3 - 1.2 mg/dL 0.5 0.4 0.7  Alkaline Phos 38 - 126 U/L 62 100 86  AST 15 - 41 U/L 20 27 22   ALT 0 - 44 U/L 17 38 16    Recent Labs  No results found for: INR, PROTIME    Imaging: Imaging Results (Last 48 hours)  No results found.     A/P: INCISIONAL HERNIA, INCARCERATED (K43.0) Story: Contains colon. Some GI symptoms as well as pain. We discussed laparoscopic hernia repair with mesh including risks of bleeding, infection, pain, scarring, injury to intra-abdominal structures, hematoma/seroma, and hernia recurrence. Her risk of wound problems and hernia recurrence is higher than average secondary to severe obesity with a BMI of 44. I advised her that ideally I would like her to be able to lose 55 or 60 pounds to reach a BMI less than 35, however given that there is bowel in the hernia and increasing severity and frequency of symptoms, we discussed small risk of bowel strangulation which would necessitate emergency surgery and discussed that repair is warranted. She understands that her risk is increased but wishes to proceed with surgery.   , MD [facility], Pager [phone]

## 2019-04-29 NOTE — Transfer of Care (Signed)
Immediate Anesthesia Transfer of Care Note  Patient: [name]  Procedure(s) Performed: LAPAROSCOPIC INCISIONAL HERNIA REPAIR WITH MESH (N/A )  Patient Location: PACU  Anesthesia Type:General  Level of Consciousness: awake and alert   Airway & Oxygen Therapy: Patient Spontanous Breathing and Patient connected to face mask oxygen  Post-op Assessment: Report given to RN, Post -op Vital signs reviewed and stable and Patient moving all extremities X 4  Post vital signs: Reviewed and stable  Last Vitals:  Vitals Value Taken Time  BP    Temp    Pulse    Resp 23 04/29/19 1227  SpO2    Vitals shown include unvalidated device data.  Last Pain:  Vitals:   04/29/19 0848  TempSrc: Oral      Patients Stated Pain Goal: 4 (71/16/57 9038)  Complications: No apparent anesthesia complications

## 2019-04-29 NOTE — Anesthesia Postprocedure Evaluation (Signed)
Anesthesia Post Note  Patient: Yesenia Andrade  Procedure(s) Performed: LAPAROSCOPIC INCISIONAL HERNIA REPAIR WITH MESH (N/A )     Patient location during evaluation: PACU Anesthesia Type: General Level of consciousness: awake and alert, oriented and patient cooperative Pain management: pain level controlled Vital Signs Assessment: post-procedure vital signs reviewed and stable Respiratory status: spontaneous breathing, nonlabored ventilation and respiratory function stable Cardiovascular status: blood pressure returned to baseline and stable Postop Assessment: no apparent nausea or vomiting Anesthetic complications: no    Last Vitals:  Vitals:   04/29/19 1254 04/29/19 1300  BP: 134/88 136/82  Pulse: 74 65  Resp:  19  Temp:    SpO2: 100% 99%    Last Pain:  Vitals:   04/29/19 1330  TempSrc:   PainSc: [place]

## 2019-04-29 NOTE — Anesthesia Preprocedure Evaluation (Addendum)
Anesthesia Evaluation  Patient identified by MRN, date of birth, ID band Patient awake    Reviewed: Allergy & Precautions, NPO status , Patient's Chart, lab work & pertinent test results  Airway Mallampati: II  TM Distance: >3 FB Neck ROM: Full    Dental  (+) Teeth Intact, Dental Advisory Given,    Pulmonary neg pulmonary ROS,    Pulmonary exam normal breath sounds clear to auscultation       Cardiovascular negative cardio ROS Normal cardiovascular exam Rhythm:Regular Rate:Normal     Neuro/Psych negative neurological ROS  negative psych ROS   GI/Hepatic Neg liver ROS, GERD  Controlled,Diet controlled GERD   Endo/Other  Morbid obesityBMI 41  Renal/GU negative Renal ROS  negative genitourinary   Musculoskeletal negative musculoskeletal ROS (+)   Abdominal (+) + obese,   Peds  Hematology negative hematology ROS (+)   Anesthesia Other Findings Incisional hernia  Reproductive/Obstetrics negative OB ROS                           Anesthesia Physical Anesthesia Plan  ASA: II  Anesthesia Plan: General   Post-op Pain Management:    Induction: Intravenous  PONV Risk Score and Plan: 4 or greater and Dexamethasone, Scopolamine patch - Pre-op, Treatment may vary due to age or medical condition and Midazolam  Airway Management Planned: Oral ETT  Additional Equipment: None  Intra-op Plan:   Post-operative Plan: Extubation in OR  Informed Consent: I have reviewed the patients History and Physical, chart, labs and discussed the procedure including the risks, benefits and alternatives for the proposed anesthesia with the patient or authorized representative who has indicated his/her understanding and acceptance.     Dental advisory given  Plan Discussed with: CRNA  Anesthesia Plan Comments:        Anesthesia Quick Evaluation

## 2019-04-29 NOTE — Op Note (Signed)
Operative Note  Yesenia Andrade  [id number]  [id number]  04/29/2019   Surgeon: Vikki Ports A ConnorMD  Assistant: OR staff  Procedure performed: Laparoscopic assisted repair of incarcerated incisional umbilical hernia with mesh underlay  Preop diagnosis: Incarcerated hernia containing colon Post-op diagnosis/intraop findings: Same  Specimens: none Retained items: None EBL: Minimal cc Complications: none  Description of procedure: After obtaining informed consent the patient was taken to the operating room and placed supine on operating room table wheregeneral endotracheal anesthesia was initiated, preoperative antibiotics were administered, SCDs applied, and a formal timeout was performed.  The abdomen was prepped and draped in usual sterile fashion.  Peritoneal access was gained using a Visiport in the left upper quadrant and insufflation to 15 mmHg ensued without incident.  Gross inspection showed no evidence of injury from our entry.  2 additional left-sided 5 mm trocar and ultimately 2 additional right-sided 5 mm trochars were inserted under direct visualization.  The hernia was visualized at the level of the umbilicus containing colon and omentum which was reduced with gentle external pressure while the abdomen was insufflated.  The bowel appeared healthy.  Omental adhesions to the midline just below the umbilicus were taken down with harmonic scalpel, and the falciform ligament was taken down with the harmonic to provide room for the mesh.  At this point a small infraumbilical incision was made and the hernia sac, which was quite large, was dissected from the surrounding soft tissues and umbilical stalk and then excised at the level of the fascia using cautery.  The hernia defect measured 3 cm x 4 cm with a more transverse orientation.  A 6"x8" ventralight mesh was brought on the the field, marked for orientation and stay sutures of 0 ethibond placed at the four cardinal directions.  This was then rolled up and inserted through the hernia defect. The fascia was then closed transversely with interrupted 0 novafils.the abdomen was reinsufflated and the fascial closure inspected, it was airtight and free of any entrapped structures. The mesh was unfurled and the orientation confirmed, and then the laparoscopic suture passer was used to bring the stay sutures through the abdominal wall in the 4 cardinal directions, these were tied down to secure the mesh to the abdominal wall, ensuring that the smooth/coated side faced the viscera without any exposed rough edges. The securestrap tacker was then used to secure the mesh to the abdominal wall circumferentially, an inner crown of tacks was also placed. On completion the mesh is flush with the abdominal wall, well-secured and without any exposed rough edges. The abdomen was inspected for hemostasis which was excellent. Bilateral laparoscopic TAPS blocks were performed with Exparel mixed with 0.25% marcaine. Each port site was also infiltrated with local. The abdomen was desufflated and all trocars removed. The skin incisions were closed with subcuticular monocryl; benzoin, steri-strips and bandaids were then applied and a cotton ball/ tegaderm pressure dressing was placed at the umbilicus. Abdominal binder applied. The patient was then awakened, extubated and taken to PACU in stable condition.   All counts were correct at the completion of the case.

## 2019-05-02 ENCOUNTER — Encounter (HOSPITAL_COMMUNITY): Payer: Self-pay | Admitting: Surgery

## 2019-09-10 ENCOUNTER — Ambulatory Visit: Payer: Medicaid Other | Attending: Internal Medicine

## 2019-09-10 DIAGNOSIS — Z23 Encounter for immunization: Secondary | ICD-10-CM

## 2019-09-10 NOTE — Progress Notes (Signed)
   Covid-19 Vaccination Clinic  Name:  Yesenia Andrade    MRN: [id number] DOB: March 08, 1991  09/10/2019  [name] was observed post Covid-19 immunization for 15 minutes without incident. She was provided with Vaccine Information Sheet and instruction to access the V-Safe system.   [name] was instructed to call 911 with any severe reactions post vaccine: Marland Kitchen Difficulty breathing  . Swelling of face and throat  . A fast heartbeat  . A bad rash all over body  . Dizziness and weakness   Immunizations Administered    Name Date Dose VIS Date Route   Pfizer COVID-19 Vaccine 09/10/2019  4:34 PM 0.3 mL 05/20/2019 Intramuscular   Manufacturer: [employer], [employer]   Lot: [id number]   NDC: [address]-9

## 2019-10-04 ENCOUNTER — Ambulatory Visit: Payer: Medicaid Other | Attending: Internal Medicine

## 2019-10-04 DIAGNOSIS — Z23 Encounter for immunization: Secondary | ICD-10-CM

## 2019-10-04 NOTE — Progress Notes (Signed)
   Covid-19 Vaccination Clinic  Name:  Yesenia Andrade    MRN: [id number] DOB: 06-14-1990  10/04/2019  [name] was observed post Covid-19 immunization for 15 minutes without incident. She was provided with Vaccine Information Sheet and instruction to access the V-Safe system.   [name] was instructed to call 911 with any severe reactions post vaccine: Marland Kitchen Difficulty breathing  . Swelling of face and throat  . A fast heartbeat  . A bad rash all over body  . Dizziness and weakness   Immunizations Administered    Name Date Dose VIS Date Route   Pfizer COVID-19 Vaccine 10/04/2019 11:29 AM 0.3 mL 08/03/2018 Intramuscular   Manufacturer: [employer], [employer]   Lot: [id number]   NDC: [address]-9

## 2019-12-08 DIAGNOSIS — Z419 Encounter for procedure for purposes other than remedying health state, unspecified: Secondary | ICD-10-CM | POA: Diagnosis not present

## 2020-02-08 DIAGNOSIS — Z419 Encounter for procedure for purposes other than remedying health state, unspecified: Secondary | ICD-10-CM | POA: Diagnosis not present

## 2020-03-26 ENCOUNTER — Emergency Department (HOSPITAL_BASED_OUTPATIENT_CLINIC_OR_DEPARTMENT_OTHER): Payer: Medicaid Other

## 2020-03-26 ENCOUNTER — Encounter (HOSPITAL_BASED_OUTPATIENT_CLINIC_OR_DEPARTMENT_OTHER): Payer: Self-pay | Admitting: *Deleted

## 2020-03-26 ENCOUNTER — Other Ambulatory Visit: Payer: Self-pay

## 2020-03-26 ENCOUNTER — Emergency Department (HOSPITAL_BASED_OUTPATIENT_CLINIC_OR_DEPARTMENT_OTHER)
Admission: EM | Admit: 2020-03-26 | Discharge: 2020-03-26 | Disposition: A | Discharge status: Home / Self Care | Payer: Medicaid Other | Attending: Emergency Medicine | Admitting: Emergency Medicine

## 2020-03-26 DIAGNOSIS — Z8669 Personal history of other diseases of the nervous system and sense organs: Secondary | ICD-10-CM | POA: Diagnosis not present

## 2020-03-26 DIAGNOSIS — R110 Nausea: Secondary | ICD-10-CM | POA: Insufficient documentation

## 2020-03-26 DIAGNOSIS — R519 Headache, unspecified: Secondary | ICD-10-CM | POA: Diagnosis not present

## 2020-03-26 DIAGNOSIS — R0602 Shortness of breath: Secondary | ICD-10-CM | POA: Diagnosis not present

## 2020-03-26 DIAGNOSIS — R9431 Abnormal electrocardiogram [ECG] [EKG]: Secondary | ICD-10-CM | POA: Diagnosis not present

## 2020-03-26 DIAGNOSIS — Z20822 Contact with and (suspected) exposure to covid-19: Secondary | ICD-10-CM | POA: Insufficient documentation

## 2020-03-26 LAB — CBC WITH DIFFERENTIAL/PLATELET
Abs Immature Granulocytes: 0.01 10*3/uL (ref 0.00–0.07)
Basophils Absolute: 0 10*3/uL (ref 0.0–0.1)
Basophils Relative: 0 %
Eosinophils Absolute: 0.1 10*3/uL (ref 0.0–0.5)
Eosinophils Relative: 3 %
HCT: 38.9 % (ref 36.0–46.0)
Hemoglobin: 12.3 g/dL (ref 12.0–15.0)
Immature Granulocytes: 0 %
Lymphocytes Relative: 18 %
Lymphs Abs: 0.9 10*3/uL (ref 0.7–4.0)
MCH: 25.7 pg — ABNORMAL LOW (ref 26.0–34.0)
MCHC: 31.6 g/dL (ref 30.0–36.0)
MCV: 81.4 fL (ref 80.0–100.0)
Monocytes Absolute: 0.4 10*3/uL (ref 0.1–1.0)
Monocytes Relative: 9 %
Neutro Abs: 3.3 10*3/uL (ref 1.7–7.7)
Neutrophils Relative %: 70 %
Platelets: 264 10*3/uL (ref 150–400)
RBC: 4.78 MIL/uL (ref 3.87–5.11)
RDW: 13.7 % (ref 11.5–15.5)
WBC: 4.8 10*3/uL (ref 4.0–10.5)
nRBC: 0 % (ref 0.0–0.2)

## 2020-03-26 LAB — RESPIRATORY PANEL BY RT PCR (FLU A&B, COVID)
Influenza A by PCR: NEGATIVE
Influenza B by PCR: NEGATIVE
SARS Coronavirus 2 by RT PCR: NEGATIVE

## 2020-03-26 LAB — BASIC METABOLIC PANEL
Anion gap: 8 (ref 5–15)
BUN: 10 mg/dL (ref 6–20)
CO2: 27 mmol/L (ref 22–32)
Calcium: 9.1 mg/dL (ref 8.9–10.3)
Chloride: 102 mmol/L (ref 98–111)
Creatinine, Ser: 0.7 mg/dL (ref 0.44–1.00)
GFR, Estimated: >60 mL/min (ref >60)
Glucose, Bld: 102 mg/dL — ABNORMAL HIGH (ref 70–99)
Potassium: 3.9 mmol/L (ref 3.5–5.1)
Sodium: 137 mmol/L (ref 135–145)

## 2020-03-26 LAB — HCG, SERUM, QUALITATIVE: Preg, Serum: NEGATIVE

## 2020-03-26 MED ORDER — KETOROLAC TROMETHAMINE 60 MG/2ML IM SOLN
30.0000 mg | Freq: Once | INTRAMUSCULAR | Status: AC
  Administered 2020-03-26: 30 mg via INTRAMUSCULAR
  Filled 2020-03-26: qty 2

## 2020-03-26 NOTE — ED Notes (Signed)
ED Provider at bedside. 

## 2020-03-26 NOTE — ED Triage Notes (Signed)
Headache and nausea today. No fever. No Covid exposure. She has had both Covid shots.

## 2020-03-26 NOTE — ED Notes (Signed)
Pt. Reports headache and HTN concerns.

## 2020-03-26 NOTE — Discharge Instructions (Addendum)
Take Tylenol or Motrin for pain control.  Follow-up with your primary doctor regarding your high blood pressure as previously scheduled.  Return to ER if you develop uncontrolled headache, vomiting, fever or other new concerning symptom.

## 2020-03-27 NOTE — ED Provider Notes (Signed)
[facility] EMERGENCY DEPARTMENT Provider Note   CSN: [id number] Arrival date & time: 03/26/20  1606     History Chief Complaint  Patient presents with  . Headache    Yesenia Andrade is a 29 y.o. female.  Reports to ER with concern for high blood pressure as well as headache.  Patient reports she has history of migraines, past headaches, headache similar to prior, somewhat worse than normal but not worst headache of her life, not sudden onset.  She also has had some mild nausea today but no vomiting.  Reports that she checked her blood pressure and it was elevated.  She has a primary care appointment later this week.  No numbness, weakness, vision changes, neck pain or stiffness, fevers.  HPI     Past Medical History:  Diagnosis Date  . GERD (gastroesophageal reflux disease)     There are no problems to display for this patient.   Past Surgical History:  Procedure Laterality Date  . CESAREAN SECTION  12/2013  . CHOLECYSTECTOMY  03/2017  . INCISIONAL HERNIA REPAIR N/A 04/29/2019   Procedure: LAPAROSCOPIC INCISIONAL HERNIA REPAIR WITH MESH;  Surgeon: Berna Bue, MD;  Location: [facility];  Service: General;  Laterality: N/A;     OB History   No obstetric history on file.     No family history on file.  Social History   Tobacco Use  . Smoking status: Never Smoker  . Smokeless tobacco: Never Used  Vaping Use  . Vaping Use: Never used  Substance Use Topics  . Alcohol use: No  . Drug use: No    Home Medications Prior to Admission medications   Medication Sig Start Date End Date Taking? Authorizing Provider  ibuprofen (MOTRIN IB) 200 MG tablet Take 1-2 tablets (200-400 mg total) by mouth every 6 (six) hours as needed for mild pain or moderate pain (Take scheduled for the first 4-5 days after surgery. then take as needed). 04/29/19 04/28/20 Yes Berna Bue, MD  acetaminophen (TYLENOL) 500 MG tablet Take 2 tablets (1,000 mg total) by mouth every  6 (six) hours as needed (Take scheduled for the first 4-5 days, then take as needed). 04/29/19 04/28/20  Berna Bue, MD  gabapentin (NEURONTIN) 300 MG capsule Take 1 capsule (300 mg total) by mouth 3 (three) times daily. 04/29/19 04/28/20  Berna Bue, MD  methocarbamol (ROBAXIN) 500 MG tablet Take 1 tablet (500 mg total) by mouth every 6 (six) hours as needed for muscle spasms. pain 04/29/19   Berna Bue, MD  oxyCODONE (ROXICODONE) 5 MG immediate release tablet Take 1 tablet (5 mg total) by mouth every 6 (six) hours as needed for severe pain. 04/29/19 04/28/20  Berna Bue, MD    Allergies    Patient has no known allergies.  Review of Systems   Review of Systems  Constitutional: Negative for chills and fever.  HENT: Negative for ear pain and sore throat.   Eyes: Negative for pain and visual disturbance.  Respiratory: Negative for cough and shortness of breath.   Cardiovascular: Negative for chest pain and palpitations.  Gastrointestinal: Positive for nausea. Negative for abdominal pain and vomiting.  Genitourinary: Negative for dysuria and hematuria.  Musculoskeletal: Negative for arthralgias and back pain.  Skin: Negative for color change and rash.  Neurological: Positive for headaches. Negative for seizures and syncope.  All other systems reviewed and are negative.   Physical Exam Updated Vital Signs BP (!) 142/95   Pulse  72   Temp 98.2 F (36.8 C) (Oral)   Resp 18   Ht 5\' 4"  (1.626 m)   Wt 117.9 kg   LMP 03/16/2020   SpO2 100%   BMI 44.63 kg/m   Physical Exam Vitals and nursing note reviewed.  Constitutional:      General: She is not in acute distress.    Appearance: She is well-developed.  HENT:     Head: Normocephalic and atraumatic.  Eyes:     Conjunctiva/sclera: Conjunctivae normal.  Cardiovascular:     Rate and Rhythm: Normal rate and regular rhythm.     Heart sounds: No murmur heard.   Pulmonary:     Effort: Pulmonary effort is  normal. No respiratory distress.     Breath sounds: Normal breath sounds.  Abdominal:     Palpations: Abdomen is soft.     Tenderness: There is no abdominal tenderness.  Musculoskeletal:     Cervical back: Neck supple.  Skin:    General: Skin is warm and dry.  Neurological:     Mental Status: She is alert.     Comments: AAOx3 CN 2-12 intact, speech clear visual fields intact 5/5 strength in b/l UE and LE Sensation to light touch intact in b/l UE and LE Normal FNF Normal gait  Psychiatric:        Mood and Affect: Mood normal.     ED Results / Procedures / Treatments   Labs (all labs ordered are listed, but only abnormal results are displayed) Labs Reviewed  CBC WITH DIFFERENTIAL/PLATELET - Abnormal; Notable for the following components:      Result Value   MCH 25.7 (*)    All other components within normal limits  BASIC METABOLIC PANEL - Abnormal; Notable for the following components:   Glucose, Bld 102 (*)    All other components within normal limits  RESPIRATORY PANEL BY RT PCR (FLU A&B, COVID)  HCG, SERUM, QUALITATIVE    EKG EKG Interpretation  Date/Time:  Monday March 26 2020 20:47:39 EDT Ventricular Rate:  79 PR Interval:    QRS Duration: 104 QT Interval:  415 QTC Calculation: 476 R Axis:   45 Text Interpretation: Sinus rhythm Borderline prolonged QT interval Confirmed by 05-05-1998 (Marianna Fuss) on 03/26/2020 9:16:27 PM Also confirmed by 03/28/2020 (Marianna Fuss), editor [id number] ([id number])  on 03/27/2020 2:50:33 PM   Radiology DG Chest Port 1 View  Result Date: 03/26/2020 CLINICAL DATA:  Shortness of breath and nausea EXAM: PORTABLE CHEST 1 VIEW COMPARISON:  10/17/2016 FINDINGS: The heart size and mediastinal contours are within normal limits. Both lungs are clear. The visualized skeletal structures are unremarkable. IMPRESSION: No active disease. Electronically Signed   By: 12/17/2016 M.D.   On: 03/26/2020 20:54    Procedures Procedures  (including critical care time)  Medications Ordered in ED Medications  ketorolac (TORADOL) injection 30 mg (30 mg Intramuscular Given 03/26/20 2036)    ED Course  I have reviewed the triage vital signs and the nursing notes.  Pertinent labs & imaging results that were available during my care of the patient were reviewed by me and considered in my medical decision making (see chart for details).    MDM Rules/Calculators/A&P                         29 year old lady presents here with concern for headache, elevated blood pressure.  In ER, patient well-appearing, normal neurologic exam, borderline hypertensive.  Suspect tension type  headache.  No red flag symptoms.  Resolved completely after single dose of Toradol.  Recommend she follow-up with her primary doctor regarding the slightly elevated blood pressure.  After the discussed management above, the patient was determined to be safe for discharge.  The patient was in agreement with this plan and all questions regarding their care were answered.  ED return precautions were discussed and the patient will return to the ED with any significant worsening of condition.    Final Clinical Impression(s) / ED Diagnoses Final diagnoses:  Nonintractable headache, unspecified chronicity pattern, unspecified headache type    Rx / DC Orders ED Discharge Orders    None       Milagros Loll, MD 03/27/20 [phone]

## 2020-03-28 DIAGNOSIS — R030 Elevated blood-pressure reading, without diagnosis of hypertension: Secondary | ICD-10-CM | POA: Diagnosis not present

## 2020-03-28 DIAGNOSIS — Z6841 Body Mass Index (BMI) 40.0 and over, adult: Secondary | ICD-10-CM | POA: Diagnosis not present

## 2020-05-02 DIAGNOSIS — Z0000 Encounter for general adult medical examination without abnormal findings: Secondary | ICD-10-CM | POA: Diagnosis not present

## 2020-05-02 DIAGNOSIS — R030 Elevated blood-pressure reading, without diagnosis of hypertension: Secondary | ICD-10-CM | POA: Diagnosis not present

## 2020-05-02 DIAGNOSIS — Z6841 Body Mass Index (BMI) 40.0 and over, adult: Secondary | ICD-10-CM | POA: Diagnosis not present

## 2020-05-02 DIAGNOSIS — Z1331 Encounter for screening for depression: Secondary | ICD-10-CM | POA: Diagnosis not present

## 2020-05-09 DIAGNOSIS — Z419 Encounter for procedure for purposes other than remedying health state, unspecified: Secondary | ICD-10-CM | POA: Diagnosis not present

## 2020-05-14 DIAGNOSIS — A084 Viral intestinal infection, unspecified: Secondary | ICD-10-CM | POA: Diagnosis not present

## 2020-06-09 DIAGNOSIS — Z419 Encounter for procedure for purposes other than remedying health state, unspecified: Secondary | ICD-10-CM | POA: Diagnosis not present

## 2020-07-01 DIAGNOSIS — R0981 Nasal congestion: Secondary | ICD-10-CM | POA: Diagnosis not present

## 2020-07-01 DIAGNOSIS — U071 COVID-19: Secondary | ICD-10-CM | POA: Diagnosis not present

## 2020-07-10 DIAGNOSIS — Z419 Encounter for procedure for purposes other than remedying health state, unspecified: Secondary | ICD-10-CM | POA: Diagnosis not present

## 2020-08-07 DIAGNOSIS — Z419 Encounter for procedure for purposes other than remedying health state, unspecified: Secondary | ICD-10-CM | POA: Diagnosis not present

## 2020-09-07 DIAGNOSIS — Z419 Encounter for procedure for purposes other than remedying health state, unspecified: Secondary | ICD-10-CM | POA: Diagnosis not present

## 2020-10-02 DIAGNOSIS — Z79899 Other long term (current) drug therapy: Secondary | ICD-10-CM | POA: Diagnosis not present

## 2020-10-02 DIAGNOSIS — Z6841 Body Mass Index (BMI) 40.0 and over, adult: Secondary | ICD-10-CM | POA: Diagnosis not present

## 2020-10-02 DIAGNOSIS — R030 Elevated blood-pressure reading, without diagnosis of hypertension: Secondary | ICD-10-CM | POA: Diagnosis not present

## 2020-10-07 DIAGNOSIS — Z419 Encounter for procedure for purposes other than remedying health state, unspecified: Secondary | ICD-10-CM | POA: Diagnosis not present

## 2020-11-07 DIAGNOSIS — Z419 Encounter for procedure for purposes other than remedying health state, unspecified: Secondary | ICD-10-CM | POA: Diagnosis not present

## 2020-12-07 DIAGNOSIS — Z419 Encounter for procedure for purposes other than remedying health state, unspecified: Secondary | ICD-10-CM | POA: Diagnosis not present

## 2021-01-07 DIAGNOSIS — Z419 Encounter for procedure for purposes other than remedying health state, unspecified: Secondary | ICD-10-CM | POA: Diagnosis not present

## 2021-02-07 DIAGNOSIS — Z419 Encounter for procedure for purposes other than remedying health state, unspecified: Secondary | ICD-10-CM | POA: Diagnosis not present

## 2021-03-09 DIAGNOSIS — Z419 Encounter for procedure for purposes other than remedying health state, unspecified: Secondary | ICD-10-CM | POA: Diagnosis not present

## 2021-03-25 ENCOUNTER — Emergency Department
Admission: EM | Admit: 2021-03-25 | Discharge: 2021-03-25 | Disposition: A | Discharge status: Home / Self Care | Payer: Medicaid Other | Attending: Student in an Organized Health Care Education/Training Program | Admitting: Student in an Organized Health Care Education/Training Program

## 2021-03-25 ENCOUNTER — Other Ambulatory Visit: Payer: Self-pay

## 2021-03-25 DIAGNOSIS — B001 Herpesviral vesicular dermatitis: Secondary | ICD-10-CM | POA: Diagnosis not present

## 2021-03-25 DIAGNOSIS — I10 Essential (primary) hypertension: Secondary | ICD-10-CM | POA: Diagnosis not present

## 2021-03-25 DIAGNOSIS — R609 Edema, unspecified: Secondary | ICD-10-CM | POA: Diagnosis present

## 2021-03-25 DIAGNOSIS — Z79899 Other long term (current) drug therapy: Secondary | ICD-10-CM | POA: Diagnosis not present

## 2021-03-25 MED ORDER — VALACYCLOVIR HCL 1 G PO TABS: 2000.0000 mg | ORAL_TABLET | Freq: Two times a day (BID) | ORAL | 3 refills | Status: AC

## 2021-03-25 MED ORDER — LOSARTAN POTASSIUM 50 MG PO TABS: 50.0000 mg | ORAL_TABLET | Freq: Every day | ORAL | 11 refills | Status: DC

## 2021-03-25 NOTE — ED Notes (Signed)
See triage note  presents with swelling to upper lip and mouth for the past couple of days   no fever  states she has been taking Lisinopril for about 1 year

## 2021-03-25 NOTE — ED Provider Notes (Signed)
[facility] Emergency Department Provider Note  ____________________________________________   Event Date/Time   First MD Initiated Contact with Patient 03/25/21 0827     (approximate)  I have reviewed the triage vital signs and the nursing notes.   HISTORY  Chief Complaint Oral Swelling    HPI Yesenia Andrade is a 30 y.o. female presents with right upper lip swelling.  Patient has a blister on the right upper lip.  But is also concerned as she takes lisinopril.  States she has had some upper lip swelling before without the blister.  Would like to change her blood pressure medication.  Lip is been swollen since last night.  No fever or chills.  No other blisters in her mouth per the patient.  Past Medical History:  Diagnosis Date   GERD (gastroesophageal reflux disease)     There are no problems to display for this patient.   Past Surgical History:  Procedure Laterality Date   CESAREAN SECTION  12/2013   CHOLECYSTECTOMY  03/2017   INCISIONAL HERNIA REPAIR N/A 04/29/2019   Procedure: LAPAROSCOPIC INCISIONAL HERNIA REPAIR WITH MESH;  Surgeon: Berna Bue, MD;  Location: [facility];  Service: General;  Laterality: N/A;    Prior to Admission medications   Medication Sig Start Date End Date Taking? Authorizing Provider  losartan (COZAAR) 50 MG tablet Take 1 tablet (50 mg total) by mouth daily. 03/25/21 03/25/22 Yes Egidio Lofgren, Roselyn Bering, PA-C  valACYclovir (VALTREX) 1000 MG tablet Take 2 tablets (2,000 mg total) by mouth 2 (two) times daily for 2 days. 03/25/21 03/27/21 Yes Pranika Finks, Roselyn Bering, PA-C  gabapentin (NEURONTIN) 300 MG capsule Take 1 capsule (300 mg total) by mouth 3 (three) times daily. 04/29/19 04/28/20  Berna Bue, MD  methocarbamol (ROBAXIN) 500 MG tablet Take 1 tablet (500 mg total) by mouth every 6 (six) hours as needed for muscle spasms. pain 04/29/19   Berna Bue, MD    Allergies Patient has no known allergies.  History  reviewed. No pertinent family history.  Social History Social History   Tobacco Use   Smoking status: Never   Smokeless tobacco: Never  Vaping Use   Vaping Use: Never used  Substance Use Topics   Alcohol use: No   Drug use: No    Review of Systems  Constitutional: No fever/chills Eyes: No visual changes. ENT: No sore throat. Respiratory: Denies cough Cardiovascular: Denies chest pain Gastrointestinal: Denies abdominal pain Genitourinary: Negative for dysuria. Musculoskeletal: Negative for back pain. Skin: Negative for rash. Psychiatric: no mood changes,     ____________________________________________   PHYSICAL EXAM:  VITAL SIGNS: ED Triage Vitals  Enc Vitals Group     BP 03/25/21 0835 137/86     Pulse Rate 03/25/21 0835 93     Resp 03/25/21 0835 20     Temp 03/25/21 0835 98 F (36.7 C)     Temp Source 03/25/21 0835 Oral     SpO2 03/25/21 0835 100 %     Weight 03/25/21 0829 259 lb 14.8 oz (117.9 kg)     Height 03/25/21 0829 5\' 4"  (1.626 m)     Head Circumference --      Peak Flow --      Pain Score 03/25/21 0822 0     Pain Loc --      Pain Edu? --      Excl. in GC? --     Constitutional: Alert and oriented. Well appearing and in no acute distress.  Eyes: Conjunctivae are normal.  Head: Atraumatic. Nose: No congestion/rhinnorhea. Mouth/Throat: Mucous membranes are moist.  Right upper lip has a fever blister noted, no other blisters noted, lip is swollen in this area, tongue appears normal Neck:  supple no lymphadenopathy noted Cardiovascular: Normal rate, regular rhythm. Heart sounds are normal Respiratory: Normal respiratory effort.  No retractions, lungs c t a  GU: deferred Musculoskeletal: FROM all extremities, warm and well perfused Neurologic:  Normal speech and language.  Skin:  Skin is warm, dry and intact. No rash noted. Psychiatric: Mood and affect are normal. Speech and behavior are normal.  ____________________________________________    LABS (all labs ordered are listed, but only abnormal results are displayed)  Labs Reviewed - No data to display ____________________________________________   ____________________________________________  RADIOLOGY    ____________________________________________   PROCEDURES  Procedure(s) performed: No  Procedures    ____________________________________________   INITIAL IMPRESSION / ASSESSMENT AND PLAN / ED COURSE  Pertinent labs & imaging results that were available during my care of the patient were reviewed by me and considered in my medical decision making (see chart for details).   The patient is a 30 year old female with history of hypertension presents with right upper lip swelling.  Patient does state lisinopril.  See HPI.  Physical exam does show the upper lip to be swollen but she also has a fever blister.  At the patient's request I will change her blood pressure medication of losartan 50 mg daily.  She is to stop the lisinopril.  Is also given a prescription for Valtrex 2 g p.o. daily for 2 days.  She is to follow-up with her regular doctor concerning her blood pressure.  Return emergency department if worsening.  Have her blood pressure rechecked at either a pharmacy, EMS station or fire station to ensure the medication is working.  Or she may monitor her blood pressure at home if she has a blood pressure cuff.  He is in agreement treatment plan.  Discharged stable condition.     Yesenia Andrade was evaluated in Emergency Department on 03/25/2021 for the symptoms described in the history of present illness. She was evaluated in the context of the global COVID-19 pandemic, which necessitated consideration that the patient might be at risk for infection with the SARS-CoV-2 virus that causes COVID-19. Institutional protocols and algorithms that pertain to the evaluation of patients at risk for COVID-19 are in a state of rapid change based on information released by  regulatory bodies including the CDC and federal and state organizations. These policies and algorithms were followed during the patient's care in the ED.    As part of my medical decision making, I reviewed the following data within the electronic MEDICAL RECORD NUMBER Nursing notes reviewed and incorporated, Old chart reviewed, Notes from prior ED visits, and [place] Controlled Substance Database  ____________________________________________   FINAL CLINICAL IMPRESSION(S) / ED DIAGNOSES  Final diagnoses:  Herpes labialis without complication  Primary hypertension      NEW MEDICATIONS STARTED DURING THIS VISIT:  Discharge Medication List as of 03/25/2021  9:02 AM     START taking these medications   Details  losartan (COZAAR) 50 MG tablet Take 1 tablet (50 mg total) by mouth daily., Starting Mon 03/25/2021, Until Tue 03/25/2022, Normal    valACYclovir (VALTREX) 1000 MG tablet Take 2 tablets (2,000 mg total) by mouth 2 (two) times daily for 2 days., Starting Mon 03/25/2021, Until Wed 03/27/2021, Normal  Note:  This document was prepared using Dragon voice recognition software and may include unintentional dictation errors.    Faythe Ghee, PA-C 03/25/21 0940    Willy Eddy, MD 03/25/21 [phone]

## 2021-03-25 NOTE — ED Triage Notes (Signed)
Pt comes with c/o lip swelling. Pt states she noticed it yesterday morning. Pt states it has gotten more swollen. Pt states she does take lisinpril and has been for a year. Pt denies any SOB or trouble swallowing. Pt is in NAD at this time.

## 2021-04-08 ENCOUNTER — Emergency Department
Admission: EM | Admit: 2021-04-08 | Discharge: 2021-04-08 | Disposition: A | Discharge status: Home / Self Care | Payer: Medicaid Other | Attending: Emergency Medicine | Admitting: Emergency Medicine

## 2021-04-08 ENCOUNTER — Encounter: Payer: Self-pay | Admitting: Emergency Medicine

## 2021-04-08 ENCOUNTER — Other Ambulatory Visit: Payer: Self-pay

## 2021-04-08 DIAGNOSIS — Z20822 Contact with and (suspected) exposure to covid-19: Secondary | ICD-10-CM | POA: Diagnosis not present

## 2021-04-08 DIAGNOSIS — J111 Influenza due to unidentified influenza virus with other respiratory manifestations: Secondary | ICD-10-CM

## 2021-04-08 DIAGNOSIS — J101 Influenza due to other identified influenza virus with other respiratory manifestations: Secondary | ICD-10-CM | POA: Diagnosis not present

## 2021-04-08 DIAGNOSIS — R059 Cough, unspecified: Secondary | ICD-10-CM | POA: Diagnosis present

## 2021-04-08 LAB — RESP PANEL BY RT-PCR (FLU A&B, COVID) ARPGX2
Influenza A by PCR: POSITIVE — AB
Influenza B by PCR: NEGATIVE
SARS Coronavirus 2 by RT PCR: NEGATIVE

## 2021-04-08 MED ORDER — PSEUDOEPH-BROMPHEN-DM 30-2-10 MG/5ML PO SYRP: 10.0000 mL | ORAL_SOLUTION | Freq: Four times a day (QID) | ORAL | 0 refills | Status: DC | PRN

## 2021-04-08 MED ORDER — ONDANSETRON 4 MG PO TBDP: 4.0000 mg | ORAL_TABLET | Freq: Three times a day (TID) | ORAL | 0 refills | Status: DC | PRN

## 2021-04-08 NOTE — ED Provider Notes (Signed)
[facility] Emergency Department Provider Note  ____________________________________________  Time seen: Approximately 9:42 AM  I have reviewed the triage vital signs and the nursing notes.   HISTORY  Chief Complaint URI   HPI Yesenia Andrade is a 30 y.o. female presents to the emergency department for treatment and evaluation of cough and congestion and decreased energy and subjective fever since last night.  Has been tested positive for flu.  No alleviating measures attempted prior to arrival.  Past Medical History:  Diagnosis Date   GERD (gastroesophageal reflux disease)     There are no problems to display for this patient.   Past Surgical History:  Procedure Laterality Date   CESAREAN SECTION  12/2013   CHOLECYSTECTOMY  03/2017   INCISIONAL HERNIA REPAIR N/A 04/29/2019   Procedure: LAPAROSCOPIC INCISIONAL HERNIA REPAIR WITH MESH;  Surgeon: Berna Bue, MD;  Location: [facility];  Service: General;  Laterality: N/A;    Prior to Admission medications   Medication Sig Start Date End Date Taking? Authorizing Provider  brompheniramine-pseudoephedrine-DM 30-2-10 MG/5ML syrup Take 10 mLs by mouth 4 (four) times daily as needed. 04/08/21  Yes Jeryl Wilbourn B, FNP  ondansetron (ZOFRAN-ODT) 4 MG disintegrating tablet Take 1 tablet (4 mg total) by mouth every 8 (eight) hours as needed for nausea or vomiting. 04/08/21  Yes Django Nguyen B, FNP  gabapentin (NEURONTIN) 300 MG capsule Take 1 capsule (300 mg total) by mouth 3 (three) times daily. 04/29/19 04/28/20  Berna Bue, MD  losartan (COZAAR) 50 MG tablet Take 1 tablet (50 mg total) by mouth daily. 03/25/21 03/25/22  Fisher, Roselyn Bering, PA-C  methocarbamol (ROBAXIN) 500 MG tablet Take 1 tablet (500 mg total) by mouth every 6 (six) hours as needed for muscle spasms. pain 04/29/19   Berna Bue, MD    Allergies Patient has no known allergies.  No family history on file.  Social  History Social History   Tobacco Use   Smoking status: Never   Smokeless tobacco: Never  Vaping Use   Vaping Use: Never used  Substance Use Topics   Alcohol use: No   Drug use: No    Review of Systems Constitutional: Positive fever/chills.  Normal appetite. ENT: No sore throat. Cardiovascular: Denies chest pain. Respiratory: No shortness of breath.  Positive for cough.  Negative wheezing.  Gastrointestinal: No nausea, no vomiting.  No diarrhea.  Musculoskeletal: Positive for body aches Skin: Negative for rash. Neurological: Negative for headaches ____________________________________________   PHYSICAL EXAM:  VITAL SIGNS: ED Triage Vitals  Enc Vitals Group     BP 04/08/21 0941 130/84     Pulse Rate 04/08/21 0941 89     Resp 04/08/21 0941 18     Temp 04/08/21 0941 98.3 F (36.8 C)     Temp Source 04/08/21 0941 Oral     SpO2 04/08/21 0941 98 %     Weight 04/08/21 0849 249 lb 1.9 oz (113 kg)     Height 04/08/21 0849 5\' 4"  (1.626 m)     Head Circumference --      Peak Flow --      Pain Score 04/08/21 0848 0     Pain Loc --      Pain Edu? --      Excl. in GC? --     Constitutional: Alert and oriented.  Overall well appearing and in no acute distress. Eyes: Conjunctivae are normal. Ears: TMs are normal Nose: No sinus congestion noted; clear rhinnorhea. Mouth/Throat:  Mucous membranes are moist.  Oropharynx mildly erythematous. Tonsils flat. Uvula midline. Neck: No stridor.  Lymphatic: No cervical lymphadenopathy. Cardiovascular: Normal rate, regular rhythm. Good peripheral circulation. Respiratory: Respirations are even and unlabored.  No retractions.  Breath sounds clear to auscultation. Gastrointestinal: Soft and nontender.  Musculoskeletal: FROM x 4 extremities.  Neurologic:  Normal speech and language. Skin:  Skin is warm, dry and intact. No rash noted. Psychiatric: Mood and affect are normal. Speech and behavior are  normal.  ____________________________________________   LABS (all labs ordered are listed, but only abnormal results are displayed)  Labs Reviewed  RESP PANEL BY RT-PCR (FLU A&B, COVID) ARPGX2 - Abnormal; Notable for the following components:      Result Value   Influenza A by PCR POSITIVE (*)    All other components within normal limits   ____________________________________________  EKG  Not indicated ____________________________________________  RADIOLOGY  None ____________________________________________   PROCEDURES  Procedure(s) performed: None  Critical Care performed: No ____________________________________________   INITIAL IMPRESSION / ASSESSMENT AND PLAN / ED COURSE  30 y.o. female presents to the emergency department for treatment and evaluation of viral symptoms as described in the HPI.  She will be tested for COVID and influenza.  Her husband tested positive for flu last night.  She will get her results off MyChart.  She was encouraged to take Tylenol or ibuprofen for body aches fever.  She was advised to follow-up with her primary care provider if her symptoms are not improving over the week.  Medications - No data to display  ED Discharge Orders          Ordered    brompheniramine-pseudoephedrine-DM 30-2-10 MG/5ML syrup  4 times daily PRN        04/08/21 0943    ondansetron (ZOFRAN-ODT) 4 MG disintegrating tablet  Every 8 hours PRN        04/08/21 0943             Pertinent labs & imaging results that were available during my care of the patient were reviewed by me and considered in my medical decision making (see chart for details).    If controlled substance prescribed during this visit, 12 month history viewed on the [facility] prior to issuing an initial prescription for Schedule II or III opiod. ____________________________________________   FINAL CLINICAL IMPRESSION(S) / ED DIAGNOSES  Final diagnoses:  Influenza-like illness     Note:  This document was prepared using Dragon voice recognition software and may include unintentional dictation errors.     Chinita Pester, FNP 04/09/21 1657    Minna Antis, MD 04/10/21 [phone]

## 2021-04-08 NOTE — ED Notes (Signed)
Pt states that she has a cough and congestion, low energy since yesterday.  States productive cough with clear mucous, fever last night.

## 2021-04-08 NOTE — ED Triage Notes (Signed)
C/O cough, runny nose, fever.  Children's father tested positive for flu.

## 2021-04-09 DIAGNOSIS — Z419 Encounter for procedure for purposes other than remedying health state, unspecified: Secondary | ICD-10-CM | POA: Diagnosis not present

## 2021-05-09 DIAGNOSIS — Z419 Encounter for procedure for purposes other than remedying health state, unspecified: Secondary | ICD-10-CM | POA: Diagnosis not present

## 2021-06-09 DIAGNOSIS — Z419 Encounter for procedure for purposes other than remedying health state, unspecified: Secondary | ICD-10-CM | POA: Diagnosis not present

## 2021-07-10 DIAGNOSIS — Z419 Encounter for procedure for purposes other than remedying health state, unspecified: Secondary | ICD-10-CM | POA: Diagnosis not present

## 2021-07-17 IMAGING — CT CT ABD-PELV W/ CM
2 of 4 series · 16 of 46 positions shown, 18 images · IV contrast (omnipaque)
Comparison: February 09, 2017

CLINICAL DATA: Abdominal pain, generalized

EXAM:
CT ABDOMEN AND PELVIS WITH CONTRAST
TECHNIQUE: Multidetector CT imaging of the abdomen and pelvis was performed
using the standard protocol following bolus administration of
intravenous contrast.
CONTRAST:  100mL OMNIPAQUE IOHEXOL 300 MG/ML  SOLN

[Series 2: axial st · axial · 0.98mm/px · z∈[-454,+6]mm · 13 of 102 slices shown, 15 images]
[im 5/102  soft-tissue]
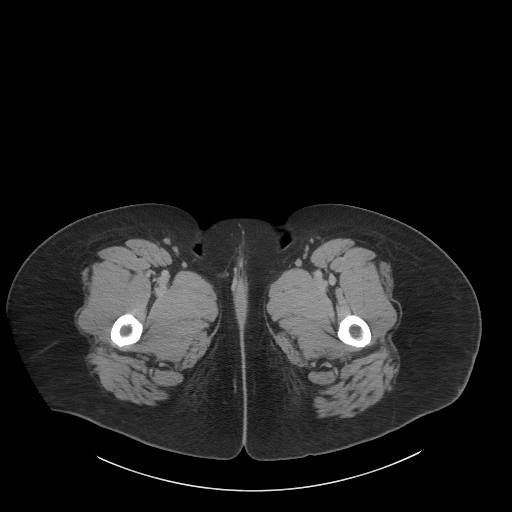
[im 5/102  bone]
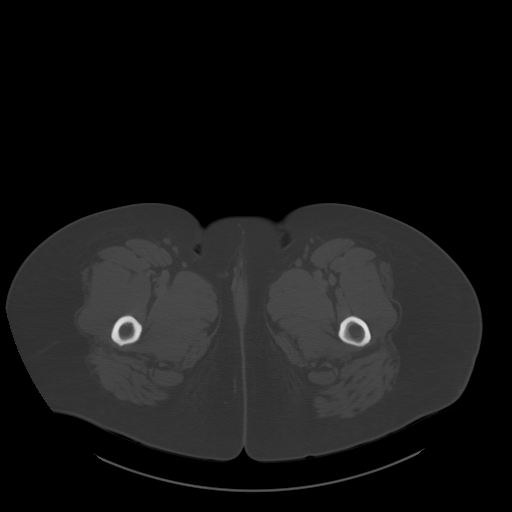
[im 14/102  soft-tissue]
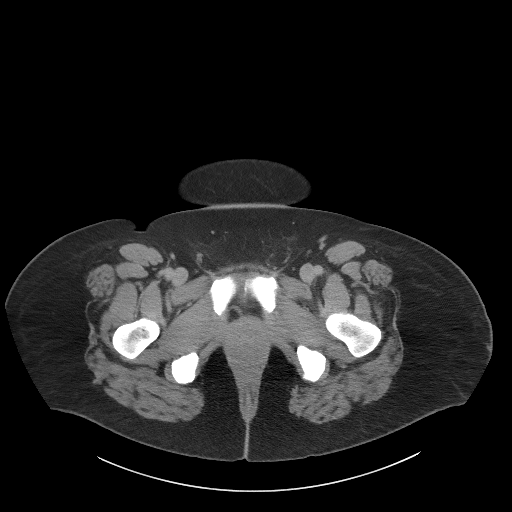
[im 22/102  soft-tissue]
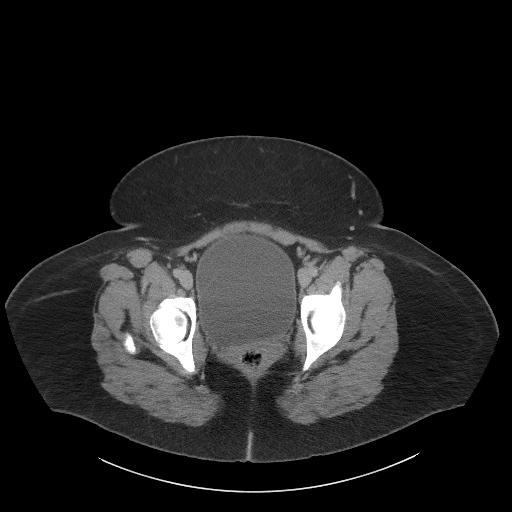
[im 27/102  soft-tissue]
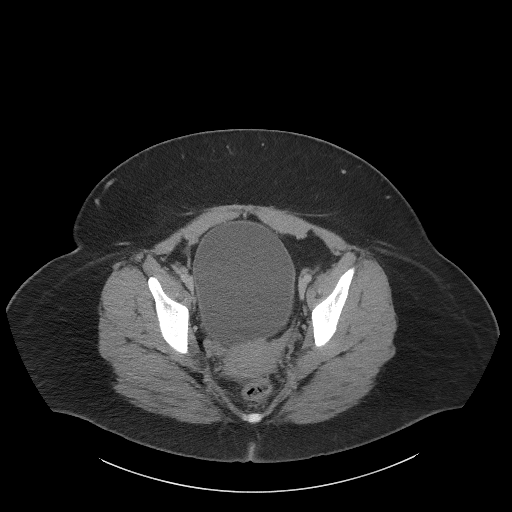
[im 36/102  soft-tissue]
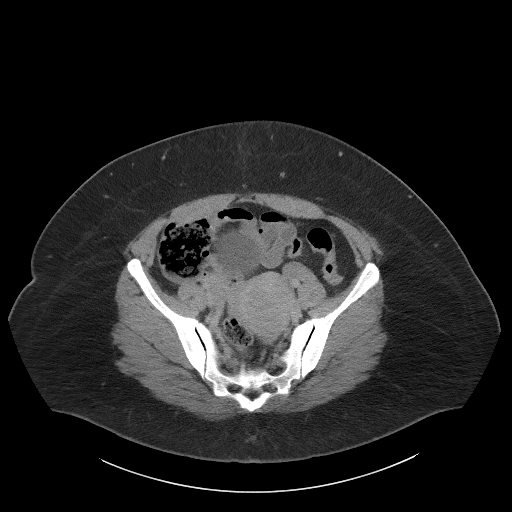
[im 44/102  soft-tissue]
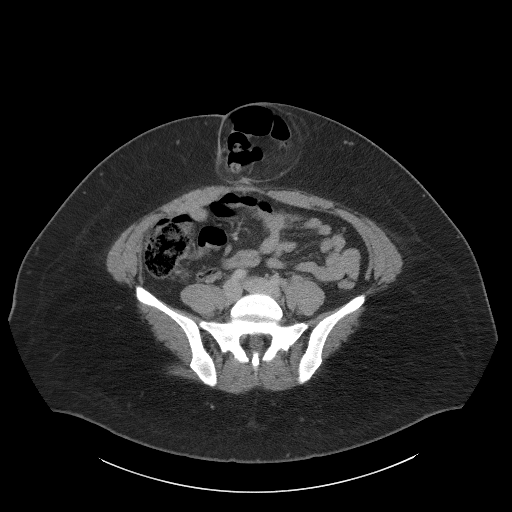
[im 53/102  soft-tissue]
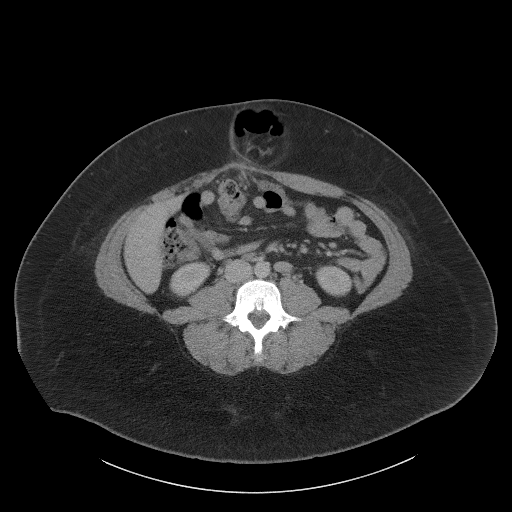
[im 58/102  soft-tissue]
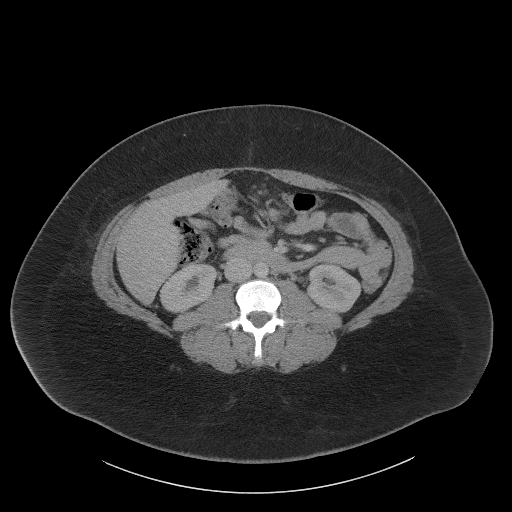
[im 66/102  soft-tissue]
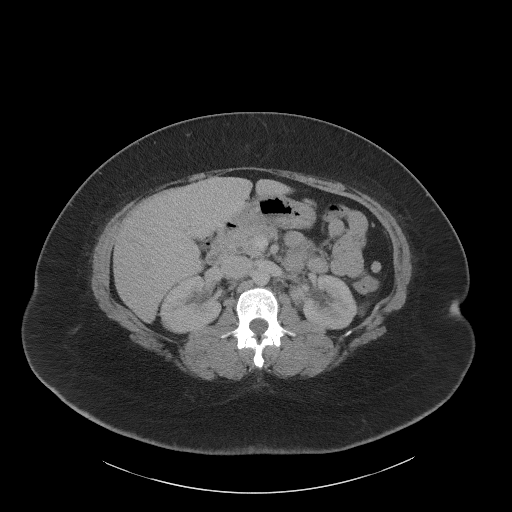
[im 66/102  bone]
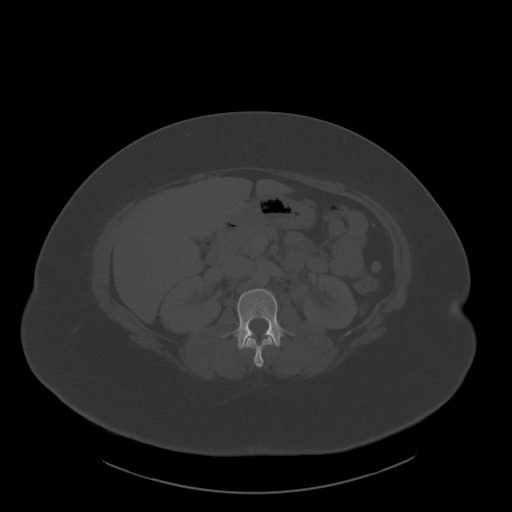
[im 75/102  soft-tissue]
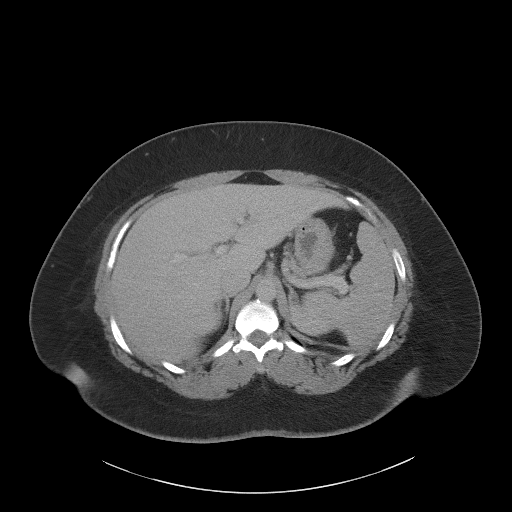
[im 80/102  soft-tissue]
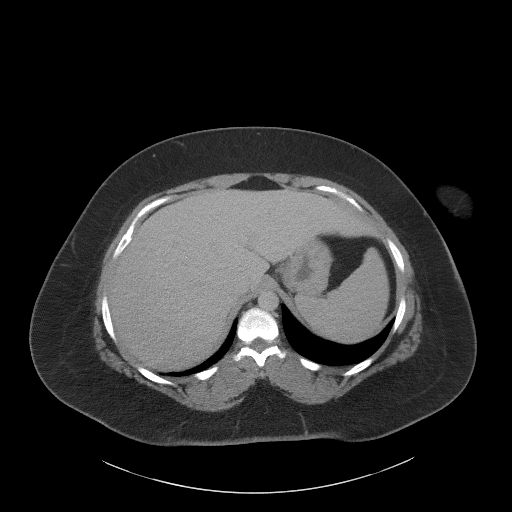
[im 88/102  soft-tissue]
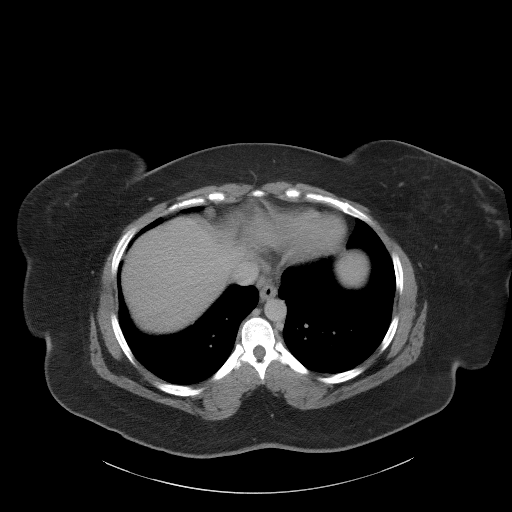
[im 97/102  soft-tissue]
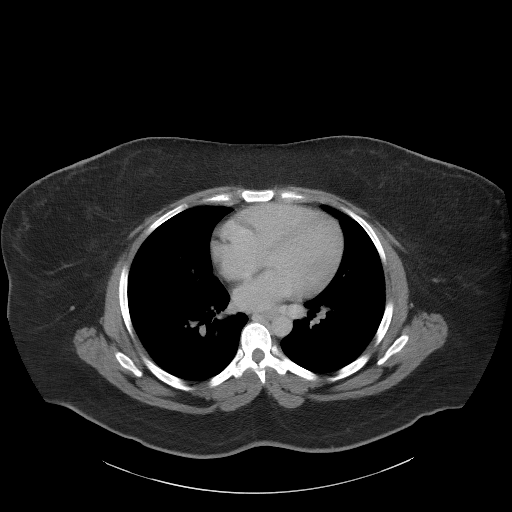

[Series 5: coronal st · coronal · 1.06mm/px · 3 of 102 slices shown]
[im 34/102  soft-tissue]
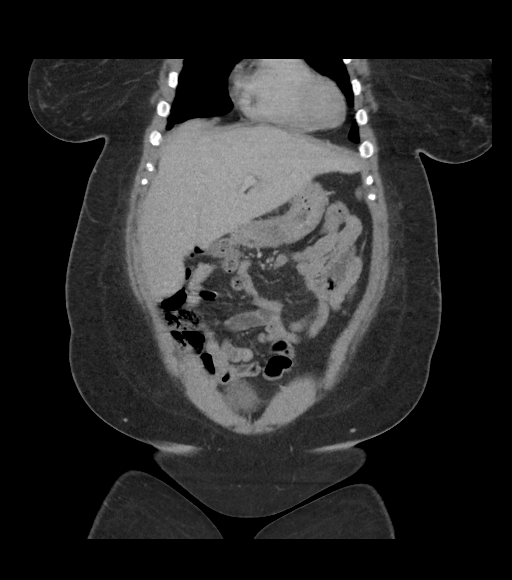
[im 45/102  soft-tissue]
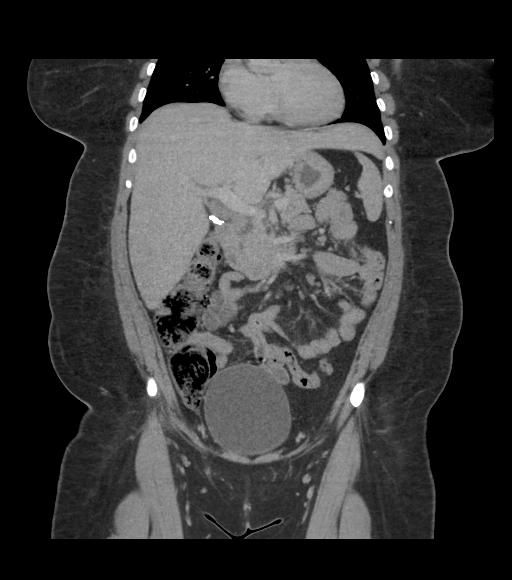
[im 57/102  soft-tissue]
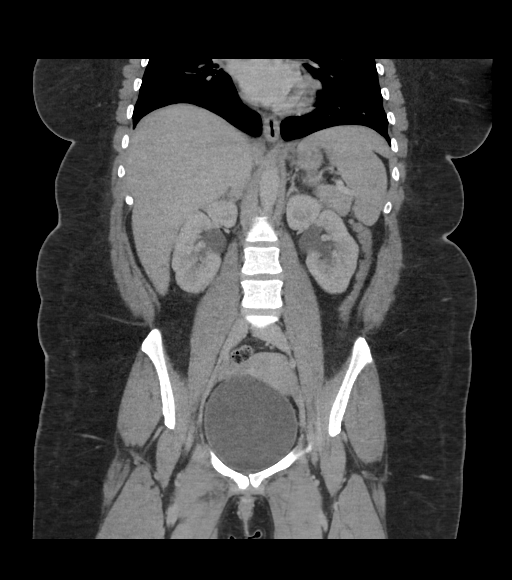

[16 of 46 positions shown; findings below may reference images not displayed]

FINDINGS: Lower chest: Lung bases are clear.

Hepatobiliary: No focal liver lesions are appreciable on this
noncontrast enhanced study. Gallbladder absent. There is no
appreciable biliary duct dilatation.

Pancreas: There is no pancreatic mass or inflammatory focus.

Spleen: No splenic lesions are evident.

Adrenals/Urinary Tract: Adrenals bilaterally appear normal. There is
no appreciable renal mass or hydronephrosis on either side. There is
an extrarenal pelvis on each side, an anatomic variant, stable in
appearance. There is a junctional parenchymal defect in each kidney,
an anatomic variant. There is no evident renal or ureteral calculus
on either side. Urinary bladder is midline with wall thickness
within normal limits.

Stomach/Bowel: There is no appreciable bowel wall or mesenteric
thickening. There is a midline ventral hernia which contains a
portion of the transverse colon. No bowel compromise is demonstrated
on this study. There is no bowel obstruction evident. The terminal
ileum appears unremarkable. No intramural air. There is no evident
free air or portal venous air.

Vascular/Lymphatic: There is no abdominal aortic aneurysm. No
vascular lesions are apparent on this study. There is no adenopathy
in the abdomen or pelvis.

Reproductive: Uterus is anteverted.  No evident pelvic mass.

Other: There is a midline ventral hernia containing a loop of
transverse colon without evident bowel compromise. The neck of the
hernia measures 4.2 cm from right to left dimension and 3.4 cm from
superior to inferior dimension. The hernia itself measures 8.4 cm
from right to left dimension, 9.1 cm from anterior to posterior
dimension, and 9.8 cm from superior to inferior dimension.

Appendix appears normal. No evident abscess or ascites in the
abdomen or pelvis.

Musculoskeletal: No blastic or lytic bone lesions. No intramuscular
lesions are evident.
IMPRESSION: 1. Fairly sizable midline ventral hernia at the umbilicus containing
a portion of transverse colon without bowel compromise.

2.  No bowel obstruction.  No intramural air.

3.  No abscess in the abdomen or pelvis.  Appendix appears normal.

4. No evident renal or ureteral calculus. No hydronephrosis. Urinary
bladder wall thickness normal.

5.  Gallbladder absent.

## 2021-08-07 DIAGNOSIS — Z419 Encounter for procedure for purposes other than remedying health state, unspecified: Secondary | ICD-10-CM | POA: Diagnosis not present

## 2021-09-07 DIAGNOSIS — Z419 Encounter for procedure for purposes other than remedying health state, unspecified: Secondary | ICD-10-CM | POA: Diagnosis not present

## 2021-10-07 DIAGNOSIS — Z419 Encounter for procedure for purposes other than remedying health state, unspecified: Secondary | ICD-10-CM | POA: Diagnosis not present

## 2021-11-07 DIAGNOSIS — Z419 Encounter for procedure for purposes other than remedying health state, unspecified: Secondary | ICD-10-CM | POA: Diagnosis not present

## 2021-12-07 DIAGNOSIS — Z419 Encounter for procedure for purposes other than remedying health state, unspecified: Secondary | ICD-10-CM | POA: Diagnosis not present

## 2022-01-07 DIAGNOSIS — Z419 Encounter for procedure for purposes other than remedying health state, unspecified: Secondary | ICD-10-CM | POA: Diagnosis not present

## 2022-02-07 DIAGNOSIS — Z419 Encounter for procedure for purposes other than remedying health state, unspecified: Secondary | ICD-10-CM | POA: Diagnosis not present

## 2022-03-09 DIAGNOSIS — Z419 Encounter for procedure for purposes other than remedying health state, unspecified: Secondary | ICD-10-CM | POA: Diagnosis not present

## 2022-04-09 DIAGNOSIS — Z419 Encounter for procedure for purposes other than remedying health state, unspecified: Secondary | ICD-10-CM | POA: Diagnosis not present

## 2022-04-28 ENCOUNTER — Ambulatory Visit: Payer: Self-pay

## 2022-04-28 NOTE — Telephone Encounter (Signed)
Patient called, left VM to return the call to the office to discuss medication with a nurse.   Summary: Medication Questions   Patient states she needs a refill on losartan (COZAAR) 50 MG tablet. Patient is scheduled for a new patient appointment on 05/28/22. Patient wants to know what her options are to get medication until she can be seen in office.

## 2022-04-28 NOTE — Telephone Encounter (Signed)
  Chief Complaint: Out of medication Symptoms:  Frequency:  Pertinent Negatives: Patient denies  Disposition: [] ED /[] Urgent Care (no appt availability in office) / [] Appointment(In office/virtual)/ []  [facility]/ [] Home Care/ [] Refused Recommended Disposition /[] [facility]/ [x]  Follow-up with PCP Additional Notes: Pt was given rx for losartan at ED. Pt is out of medication. She has not been seen by any of our provider's pt will go to UC or bus for refill.    Summary: Medication Questions    Patient states she needs a refill on losartan (COZAAR) 50 MG tablet. Patient is scheduled for a new patient appointment on 05/28/22. Patient wants to know what her options are to get medication until she can be seen in office.          Reason for Disposition  [1] Prescription refill request for ESSENTIAL medicine (i.e., likelihood of harm to patient if not taken) AND [2] triager unable to refill per department policy  Answer Assessment - Initial Assessment Questions 1. DRUG NAME: "What medicine do you need to have refilled?"     Losartan 2. REFILLS REMAINING: "How many refills are remaining?" (Note: The label on the medicine or pill bottle will show how many refills are remaining. If there are no refills remaining, then a renewal may be needed.)     None 3. EXPIRATION DATE: "What is the expiration date?" (Note: The label states when the prescription will expire, and thus can no longer be refilled.)      4. PRESCRIBING HCP: "Who prescribed it?" Reason: If prescribed by specialist, call should be referred to that group.     ED 5. SYMPTOMS: "Do you have any symptoms?"     no 6. PREGNANCY: "Is there any chance that you are pregnant?" "When was your last menstrual period?"  Protocols used: Medication Refill and Renewal Call-A-AH

## 2022-05-09 DIAGNOSIS — Z419 Encounter for procedure for purposes other than remedying health state, unspecified: Secondary | ICD-10-CM | POA: Diagnosis not present

## 2022-05-28 ENCOUNTER — Ambulatory Visit (INDEPENDENT_AMBULATORY_CARE_PROVIDER_SITE_OTHER): Payer: BC Managed Care – PPO | Admitting: Family Medicine

## 2022-05-28 ENCOUNTER — Encounter: Payer: Self-pay | Admitting: Family Medicine

## 2022-05-28 VITALS — BP 117/80 | HR 85 | Temp 99.0°F | Ht 64.0 in | Wt 264.9 lb

## 2022-05-28 DIAGNOSIS — Z13220 Encounter for screening for lipoid disorders: Secondary | ICD-10-CM | POA: Diagnosis not present

## 2022-05-28 DIAGNOSIS — Z6841 Body Mass Index (BMI) 40.0 and over, adult: Secondary | ICD-10-CM

## 2022-05-28 DIAGNOSIS — I10 Essential (primary) hypertension: Secondary | ICD-10-CM

## 2022-05-28 DIAGNOSIS — Z114 Encounter for screening for human immunodeficiency virus [HIV]: Secondary | ICD-10-CM

## 2022-05-28 DIAGNOSIS — Z1159 Encounter for screening for other viral diseases: Secondary | ICD-10-CM | POA: Diagnosis not present

## 2022-05-28 LAB — MICROALBUMIN, URINE WAIVED
Creatinine, Urine Waived: 200 mg/dL (ref 10–300)
Microalb, Ur Waived: 30 mg/L — ABNORMAL HIGH (ref 0–19)
Microalb/Creat Ratio: <30 mg/g (ref <30)

## 2022-05-28 LAB — URINALYSIS, ROUTINE W REFLEX MICROSCOPIC
Bilirubin, UA: NEGATIVE
Glucose, UA: NEGATIVE
Ketones, UA: NEGATIVE
Nitrite, UA: NEGATIVE
RBC, UA: NEGATIVE
Specific Gravity, UA: 1.025 (ref 1.005–1.030)
Urobilinogen, Ur: 1 mg/dL (ref 0.2–1.0)
pH, UA: 6 (ref 5.0–7.5)

## 2022-05-28 LAB — MICROSCOPIC EXAMINATION: Bacteria, UA: NONE SEEN

## 2022-05-28 MED ORDER — AMLODIPINE BESYLATE 5 MG PO TABS: 5.0000 mg | ORAL_TABLET | Freq: Every day | ORAL | 2 refills | Status: DC

## 2022-05-28 NOTE — Assessment & Plan Note (Signed)
Checking labs today. Await results.  

## 2022-05-28 NOTE — Assessment & Plan Note (Signed)
Had angioedema on lisinopril. Has been doing well with losartan, but given cross-reactivity, will change to amlodipine. Recheck 1 month. Call with any concerns.

## 2022-05-28 NOTE — Progress Notes (Signed)
BP 117/80   Pulse 85   Temp 99 F (37.2 C) (Oral)   Ht 5\' 4"  (1.626 m)   Wt 264 lb 14.4 oz (120.2 kg)   SpO2 98%   BMI 45.47 kg/m    Subjective:    Patient ID: , female    DOB: 06-18-1990, 31 y.o.   MRN: [id number]  HPI: Yesenia Andrade is a 31 y.o. female who presents today to establish care.   Chief Complaint  Patient presents with   New Patient (Initial Visit)   Hypertension    Patient says she is here cause she has High Blood Pressure and has ran out of her medication and wanted to discuss with provider.    HYPERTENSION  Hypertension status: controlled  Satisfied with current treatment? yes Duration of hypertension: chronic BP monitoring frequency:  not checking BP medication side effects:  no Medication compliance: good compliance Previous BP meds:losartan, lisinopril Aspirin: no Recurrent headaches: no Visual changes: no Palpitations: no Dyspnea: no Chest pain: no Lower extremity edema: yes Dizzy/lightheaded: no     05/28/2022   11:07 AM  Depression screen PHQ 2/9  Decreased Interest 1  Down, Depressed, Hopeless 1  PHQ - 2 Score 2  Altered sleeping 0  Tired, decreased energy 1  Change in appetite 1  Feeling bad or failure about yourself  2  Trouble concentrating 2  Moving slowly or fidgety/restless 0  Suicidal thoughts 0  PHQ-9 Score 8  Difficult doing work/chores Somewhat difficult    Active Ambulatory Problems    Diagnosis Date Noted   BMI 40.0-44.9, adult (HCC) 12/09/2016   Primary hypertension 05/28/2022   Resolved Ambulatory Problems    Diagnosis Date Noted   Calculus of gallbladder with chronic cholecystitis without obstruction 12/09/2016   Postoperative examination 01/19/2017   RUQ pain 12/09/2016   Past Medical History:  Diagnosis Date   GERD (gastroesophageal reflux disease)    Hypertension    Past Surgical History:  Procedure Laterality Date   CESAREAN SECTION  12/2013   CHOLECYSTECTOMY  03/2017    INCISIONAL HERNIA REPAIR N/A 04/29/2019   Procedure: LAPAROSCOPIC INCISIONAL HERNIA REPAIR WITH MESH;  Surgeon: 05/01/2019, MD;  Location: [facility];  Service: General;  Laterality: N/A;   Outpatient Encounter Medications as of 05/28/2022  Medication Sig   amLODipine (NORVASC) 5 MG tablet Take 1 tablet (5 mg total) by mouth daily.   gabapentin (NEURONTIN) 300 MG capsule Take 1 capsule (300 mg total) by mouth 3 (three) times daily.   [DISCONTINUED] brompheniramine-pseudoephedrine-DM 30-2-10 MG/5ML syrup Take 10 mLs by mouth 4 (four) times daily as needed. (Patient not taking: Reported on 05/28/2022)   [DISCONTINUED] losartan (COZAAR) 50 MG tablet Take 1 tablet (50 mg total) by mouth daily.   [DISCONTINUED] methocarbamol (ROBAXIN) 500 MG tablet Take 1 tablet (500 mg total) by mouth every 6 (six) hours as needed for muscle spasms. pain (Patient not taking: Reported on 05/28/2022)   [DISCONTINUED] ondansetron (ZOFRAN-ODT) 4 MG disintegrating tablet Take 1 tablet (4 mg total) by mouth every 8 (eight) hours as needed for nausea or vomiting. (Patient not taking: Reported on 05/28/2022)   No facility-administered encounter medications on file as of 05/28/2022.   Social History   Socioeconomic History   Marital status: Single    Spouse name: Not on file   Number of children: Not on file   Years of education: Not on file   Highest education level: Not on file  Occupational History  Not on file  Tobacco Use   Smoking status: Never   Smokeless tobacco: Never  Vaping Use   Vaping Use: Never used  Substance and Sexual Activity   Alcohol use: No   Drug use: No   Sexual activity: Not on file  Other Topics Concern   Not on file  Social History Narrative   Not on file   Social Determinants of Health   Financial Resource Strain: Not on file  Food Insecurity: Not on file  Transportation Needs: Not on file  Physical Activity: Not on file  Stress: Not on file  Social Connections: Not on  file   Family History  Problem Relation Age of Onset   Hypertension Mother    Diabetes Father    Hypertension Father      Review of Systems  Constitutional: Negative.   HENT: Negative.    Respiratory: Negative.    Cardiovascular: Negative.   Musculoskeletal: Negative.   Psychiatric/Behavioral: Negative.      Per HPI unless specifically indicated above     Objective:    BP 117/80   Pulse 85   Temp 99 F (37.2 C) (Oral)   Ht 5\' 4"  (1.626 m)   Wt 264 lb 14.4 oz (120.2 kg)   SpO2 98%   BMI 45.47 kg/m   Wt Readings from Last 3 Encounters:  05/28/22 264 lb 14.4 oz (120.2 kg)  04/08/21 249 lb 1.9 oz (113 kg)  03/25/21 250 lb (113.4 kg)    Physical Exam Vitals and nursing note reviewed.  Constitutional:      General: She is not in acute distress.    Appearance: Normal appearance. She is obese. She is not ill-appearing, toxic-appearing or diaphoretic.  HENT:     Head: Normocephalic and atraumatic.     Right Ear: External ear normal.     Left Ear: External ear normal.     Nose: Nose normal.     Mouth/Throat:     Mouth: Mucous membranes are moist.     Pharynx: Oropharynx is clear.  Eyes:     General: No scleral icterus.       Right eye: No discharge.        Left eye: No discharge.     Extraocular Movements: Extraocular movements intact.     Conjunctiva/sclera: Conjunctivae normal.     Pupils: Pupils are equal, round, and reactive to light.  Cardiovascular:     Rate and Rhythm: Normal rate and regular rhythm.     Pulses: Normal pulses.     Heart sounds: Normal heart sounds. No murmur heard.    No friction rub. No gallop.  Pulmonary:     Effort: Pulmonary effort is normal. No respiratory distress.     Breath sounds: Normal breath sounds. No stridor. No wheezing, rhonchi or rales.  Chest:     Chest wall: No tenderness.  Musculoskeletal:        General: Normal range of motion.     Cervical back: Normal range of motion and neck supple.  Skin:    General: Skin  is warm and dry.     Capillary Refill: Capillary refill takes less than 2 seconds.     Coloration: Skin is not jaundiced or pale.     Findings: No bruising, erythema, lesion or rash.  Neurological:     General: No focal deficit present.     Mental Status: She is alert and oriented to person, place, and time. Mental status is at baseline.  Psychiatric:  Mood and Affect: Mood normal.        Behavior: Behavior normal.        Thought Content: Thought content normal.        Judgment: Judgment normal.     Results for orders placed or performed in visit on 05/28/22  Microscopic Examination   Urine  Result Value Ref Range   WBC, UA 0-5 0 - 5 /hpf   RBC, Urine 0-2 0 - 2 /hpf   Epithelial Cells (non renal) 0-10 0 - 10 /hpf   Mucus, UA Present (A) Not Estab.   Bacteria, UA None seen None seen/Few  Microalbumin, Urine Waived  Result Value Ref Range   Microalb, Ur Waived 30 (H) 0 - 19 mg/L   Creatinine, Urine Waived 200 10 - 300 mg/dL   Microalb/Creat Ratio <30 <30 mg/g  Urinalysis, Routine w reflex microscopic  Result Value Ref Range   Specific Gravity, UA 1.025 1.005 - 1.030   pH, UA 6.0 5.0 - 7.5   Color, UA Yellow Yellow   Appearance Ur Clear Clear   Leukocytes,UA 1+ (A) Negative   Protein,UA Trace (A) Negative/Trace   Glucose, UA Negative Negative   Ketones, UA Negative Negative   RBC, UA Negative Negative   Bilirubin, UA Negative Negative   Urobilinogen, Ur 1.0 0.2 - 1.0 mg/dL   Nitrite, UA Negative Negative   Microscopic Examination See below:       Assessment & Plan:   Problem List Items Addressed This Visit       Cardiovascular and Mediastinum   Primary hypertension - Primary    Had angioedema on lisinopril. Has been doing well with losartan, but given cross-reactivity, will change to amlodipine. Recheck 1 month. Call with any concerns.       Relevant Medications   amLODipine (NORVASC) 5 MG tablet   Other Relevant Orders   Microalbumin, Urine Waived  (Completed)   CBC with Differential/Platelet   Urinalysis, Routine w reflex microscopic (Completed)   TSH     Other   BMI 40.0-44.9, adult ([place])    Checking labs today. Await results.       Relevant Orders   CBC with Differential/Platelet   Comprehensive metabolic panel   Hgb 123456 w/o eAG   Other Visit Diagnoses     Screening for cholesterol level       Labs drawn today. Await results.   Relevant Orders   Lipid Panel w/o Chol/HDL Ratio   Need for hepatitis C screening test       Labs drawn today. Await results.   Relevant Orders   Hepatitis C Antibody   Encounter for screening for HIV       Labs drawn today. Await results.   Relevant Orders   HIV Antibody (routine testing w rflx)        Follow up plan: Return 1 month physical, for records release from PCP in [place].

## 2022-05-29 LAB — COMPREHENSIVE METABOLIC PANEL
ALT: 12 IU/L (ref 0–32)
AST: 20 IU/L (ref 0–40)
Albumin/Globulin Ratio: 1.1 — ABNORMAL LOW (ref 1.2–2.2)
Albumin: 4 g/dL (ref 3.9–4.9)
Alkaline Phosphatase: 90 IU/L (ref 44–121)
BUN/Creatinine Ratio: 11 (ref 9–23)
BUN: 8 mg/dL (ref 6–20)
Bilirubin Total: 0.8 mg/dL (ref 0.0–1.2)
CO2: 26 mmol/L (ref 20–29)
Calcium: 9.2 mg/dL (ref 8.7–10.2)
Chloride: 99 mmol/L (ref 96–106)
Creatinine, Ser: 0.73 mg/dL (ref 0.57–1.00)
Globulin, Total: 3.6 g/dL (ref 1.5–4.5)
Glucose: 99 mg/dL (ref 70–99)
Potassium: 4.2 mmol/L (ref 3.5–5.2)
Sodium: 138 mmol/L (ref 134–144)
Total Protein: 7.6 g/dL (ref 6.0–8.5)
eGFR: 112 mL/min/{1.73_m2} (ref >59)

## 2022-05-29 LAB — CBC WITH DIFFERENTIAL/PLATELET
Basophils Absolute: 0 10*3/uL (ref 0.0–0.2)
Basos: 0 %
EOS (ABSOLUTE): 0.1 10*3/uL (ref 0.0–0.4)
Eos: 3 %
Hematocrit: 36 % (ref 34.0–46.6)
Hemoglobin: 11.1 g/dL (ref 11.1–15.9)
Immature Grans (Abs): 0 10*3/uL (ref 0.0–0.1)
Immature Granulocytes: 0 %
Lymphocytes Absolute: 1.1 10*3/uL (ref 0.7–3.1)
Lymphs: 23 %
MCH: 23.3 pg — ABNORMAL LOW (ref 26.6–33.0)
MCHC: 30.8 g/dL — ABNORMAL LOW (ref 31.5–35.7)
MCV: 76 fL — ABNORMAL LOW (ref 79–97)
Monocytes Absolute: 0.4 10*3/uL (ref 0.1–0.9)
Monocytes: 9 %
Neutrophils Absolute: 3.1 10*3/uL (ref 1.4–7.0)
Neutrophils: 65 %
Platelets: 339 10*3/uL (ref 150–450)
RBC: 4.76 x10E6/uL (ref 3.77–5.28)
RDW: 15.4 % (ref 11.7–15.4)
WBC: 4.7 10*3/uL (ref 3.4–10.8)

## 2022-05-29 LAB — LIPID PANEL W/O CHOL/HDL RATIO
Cholesterol, Total: 148 mg/dL (ref 100–199)
HDL: 29 mg/dL — ABNORMAL LOW (ref >39)
LDL Chol Calc (NIH): 95 mg/dL (ref 0–99)
Triglycerides: 130 mg/dL (ref 0–149)
VLDL Cholesterol Cal: 24 mg/dL (ref 5–40)

## 2022-05-29 LAB — HIV ANTIBODY (ROUTINE TESTING W REFLEX): HIV Screen 4th Generation wRfx: NONREACTIVE

## 2022-05-29 LAB — HGB A1C W/O EAG: Hgb A1c MFr Bld: 6 % — ABNORMAL HIGH (ref 4.8–5.6)

## 2022-05-29 LAB — HEPATITIS C ANTIBODY: Hep C Virus Ab: NONREACTIVE

## 2022-05-29 LAB — TSH: TSH: 1.08 u[IU]/mL (ref 0.450–4.500)

## 2022-06-09 DIAGNOSIS — Z419 Encounter for procedure for purposes other than remedying health state, unspecified: Secondary | ICD-10-CM | POA: Diagnosis not present

## 2022-06-15 ENCOUNTER — Emergency Department
Admission: EM | Admit: 2022-06-15 | Discharge: 2022-06-15 | Discharge status: Against Medical Advice | Payer: Medicaid Other | Attending: Physician Assistant | Admitting: Physician Assistant

## 2022-06-15 ENCOUNTER — Other Ambulatory Visit: Payer: Self-pay

## 2022-06-15 DIAGNOSIS — L301 Dyshidrosis [pompholyx]: Secondary | ICD-10-CM | POA: Insufficient documentation

## 2022-06-15 DIAGNOSIS — Z5321 Procedure and treatment not carried out due to patient leaving prior to being seen by health care provider: Secondary | ICD-10-CM | POA: Diagnosis not present

## 2022-06-15 DIAGNOSIS — R21 Rash and other nonspecific skin eruption: Secondary | ICD-10-CM | POA: Diagnosis present

## 2022-06-15 DIAGNOSIS — L739 Follicular disorder, unspecified: Secondary | ICD-10-CM | POA: Diagnosis not present

## 2022-06-15 MED ORDER — TRIAMCINOLONE ACETONIDE 0.1 % EX CREA: 1.0000 | TOPICAL_CREAM | Freq: Two times a day (BID) | CUTANEOUS | 0 refills | Status: DC

## 2022-06-15 MED ORDER — CEPHALEXIN 500 MG PO CAPS: 500.0000 mg | ORAL_CAPSULE | Freq: Three times a day (TID) | ORAL | 0 refills | Status: DC

## 2022-06-15 NOTE — Discharge Instructions (Signed)
CeraVe or Cetaphil cream

## 2022-06-15 NOTE — ED Triage Notes (Signed)
Pt reports for the past month she has had intermittent episodes of rashes breaking out over parts of her body. Pt reports on her legs, torso and arms. Pt states the rash itches when it comes. Pt reports was originally concerned she had bed bugs but checked and they did not. Pt reports no one else in the house has had any rashes. Pt denies new soaps, clothes, lotions, foods or medication changes that may have been the cause.

## 2022-07-01 ENCOUNTER — Other Ambulatory Visit (HOSPITAL_COMMUNITY)
Admission: RE | Admit: 2022-07-01 | Discharge: 2022-07-01 | Disposition: A | Discharge status: Home / Self Care | Payer: Medicaid Other | Source: Ambulatory Visit | Attending: Family Medicine | Admitting: Family Medicine

## 2022-07-01 ENCOUNTER — Ambulatory Visit (INDEPENDENT_AMBULATORY_CARE_PROVIDER_SITE_OTHER): Payer: Medicaid Other | Admitting: Family Medicine

## 2022-07-01 ENCOUNTER — Encounter: Payer: Self-pay | Admitting: Family Medicine

## 2022-07-01 VITALS — BP 130/81 | HR 77 | Temp 98.6°F | Ht 64.0 in | Wt 265.4 lb

## 2022-07-01 DIAGNOSIS — I10 Essential (primary) hypertension: Secondary | ICD-10-CM | POA: Diagnosis not present

## 2022-07-01 DIAGNOSIS — K219 Gastro-esophageal reflux disease without esophagitis: Secondary | ICD-10-CM | POA: Diagnosis not present

## 2022-07-01 DIAGNOSIS — Z0000 Encounter for general adult medical examination without abnormal findings: Secondary | ICD-10-CM | POA: Insufficient documentation

## 2022-07-01 MED ORDER — OMEPRAZOLE 20 MG PO CPDR: 20.0000 mg | DELAYED_RELEASE_CAPSULE | Freq: Every day | ORAL | 1 refills | Status: DC

## 2022-07-01 MED ORDER — AMLODIPINE BESYLATE 5 MG PO TABS: 5.0000 mg | ORAL_TABLET | Freq: Every day | ORAL | 1 refills | Status: DC

## 2022-07-01 NOTE — Progress Notes (Signed)
BP 130/81   Pulse 77   Temp 98.6 F (37 C) (Oral)   Ht 5\' 4"  (1.626 m)   Wt 265 lb 6.4 oz (120.4 kg)   LMP 06/04/2022 (Approximate)   SpO2 100%   BMI 45.56 kg/m    Subjective:    Patient ID: Yesenia Andrade, female    DOB: 1990-09-21, 32 y.o.   MRN: [id number]  HPI: Yesenia Andrade is a 32 y.o. female presenting on 07/01/2022 for comprehensive medical examination. Current medical complaints include:  HYPERTENSION  Hypertension status: controlled  Satisfied with current treatment? yes Duration of hypertension: chronic BP monitoring frequency:  not checking BP medication side effects:  no Medication compliance: excellent compliance Previous BP meds:amlodipine, lisinopril, losartan Aspirin: no Recurrent headaches: no Visual changes: no Palpitations: no Dyspnea: no Chest pain: no Lower extremity edema: no Dizzy/lightheaded: no  Menopausal Symptoms: no  Depression Screen done today and results listed below:     07/01/2022    9:20 AM 05/28/2022   11:07 AM  Depression screen PHQ 2/9  Decreased Interest 1 1  Down, Depressed, Hopeless 1 1  PHQ - 2 Score 2 2  Altered sleeping 2 0  Tired, decreased energy 2 1  Change in appetite 2 1  Feeling bad or failure about yourself  1 2  Trouble concentrating 1 2  Moving slowly or fidgety/restless 1 0  Suicidal thoughts 0 0  PHQ-9 Score 11 8  Difficult doing work/chores Somewhat difficult Somewhat difficult    Past Medical History:  Past Medical History:  Diagnosis Date   GERD (gastroesophageal reflux disease)    Hypertension     Surgical History:  Past Surgical History:  Procedure Laterality Date   CESAREAN SECTION  12/2013   CHOLECYSTECTOMY  03/2017   INCISIONAL HERNIA REPAIR N/A 04/29/2019   Procedure: LAPAROSCOPIC INCISIONAL HERNIA REPAIR WITH MESH;  Surgeon: Clovis Riley, MD;  Location: [facility];  Service: General;  Laterality: N/A;    Medications:  Current Outpatient Medications on File Prior to Visit   Medication Sig   triamcinolone cream (KENALOG) 0.1 % Apply 1 Application topically 2 (two) times daily.   gabapentin (NEURONTIN) 300 MG capsule Take 1 capsule (300 mg total) by mouth 3 (three) times daily.   No current facility-administered medications on file prior to visit.    Allergies:  Allergies  Allergen Reactions   Lisinopril Swelling    Lip swelling    Social History:  Social History   Socioeconomic History   Marital status: Single    Spouse name: Not on file   Number of children: Not on file   Years of education: Not on file   Highest education level: Not on file  Occupational History   Not on file  Tobacco Use   Smoking status: Never   Smokeless tobacco: Never  Vaping Use   Vaping Use: Never used  Substance and Sexual Activity   Alcohol use: No   Drug use: No   Sexual activity: Not on file  Other Topics Concern   Not on file  Social History Narrative   Not on file   Social Determinants of Health   Financial Resource Strain: Not on file  Food Insecurity: Not on file  Transportation Needs: Not on file  Physical Activity: Not on file  Stress: Not on file  Social Connections: Not on file  Intimate Partner Violence: Not on file   Social History   Tobacco Use  Smoking Status Never  Smokeless Tobacco  Never   Social History   Substance and Sexual Activity  Alcohol Use No    Family History:  Family History  Problem Relation Age of Onset   Hypertension Mother    Diabetes Father    Hypertension Father     Past medical history, surgical history, medications, allergies, family history and social history reviewed with patient today and changes made to appropriate areas of the chart.   Review of Systems  Constitutional: Negative.   HENT: Negative.    Eyes: Negative.   Respiratory: Negative.    Cardiovascular: Negative.   Gastrointestinal:  Positive for heartburn and nausea. Negative for abdominal pain, blood in stool, constipation, diarrhea,  melena and vomiting.  Genitourinary: Negative.   Musculoskeletal: Negative.   Skin: Negative.   Neurological: Negative.   Endo/Heme/Allergies:  Positive for environmental allergies. Negative for polydipsia. Does not bruise/bleed easily.  Psychiatric/Behavioral:  Negative for depression, hallucinations, memory loss, substance abuse and suicidal ideas. The patient is nervous/anxious. The patient does not have insomnia.    All other ROS negative except what is listed above and in the HPI.      Objective:    BP 130/81   Pulse 77   Temp 98.6 F (37 C) (Oral)   Ht 5\' 4"  (1.626 m)   Wt 265 lb 6.4 oz (120.4 kg)   LMP 06/04/2022 (Approximate)   SpO2 100%   BMI 45.56 kg/m   Wt Readings from Last 3 Encounters:  07/01/22 265 lb 6.4 oz (120.4 kg)  06/15/22 264 lb 8.8 oz (120 kg)  05/28/22 264 lb 14.4 oz (120.2 kg)    Physical Exam Vitals and nursing note reviewed. Exam conducted with a chaperone present.  Constitutional:      General: She is not in acute distress.    Appearance: Normal appearance. She is obese. She is not ill-appearing, toxic-appearing or diaphoretic.  HENT:     Head: Normocephalic and atraumatic.     Right Ear: Tympanic membrane, ear canal and external ear normal. There is no impacted cerumen.     Left Ear: Tympanic membrane, ear canal and external ear normal. There is no impacted cerumen.     Nose: Nose normal. No congestion or rhinorrhea.     Mouth/Throat:     Mouth: Mucous membranes are moist.     Pharynx: Oropharynx is clear. No oropharyngeal exudate or posterior oropharyngeal erythema.  Eyes:     General: No scleral icterus.       Right eye: No discharge.        Left eye: No discharge.     Extraocular Movements: Extraocular movements intact.     Conjunctiva/sclera: Conjunctivae normal.     Pupils: Pupils are equal, round, and reactive to light.  Neck:     Vascular: No carotid bruit.  Cardiovascular:     Rate and Rhythm: Normal rate and regular rhythm.      Pulses: Normal pulses.     Heart sounds: No murmur heard.    No friction rub. No gallop.  Pulmonary:     Effort: Pulmonary effort is normal. No respiratory distress.     Breath sounds: Normal breath sounds. No stridor. No wheezing, rhonchi or rales.  Chest:     Chest wall: No tenderness.  Breasts:    Right: Normal.     Left: Normal.  Abdominal:     General: Abdomen is flat. Bowel sounds are normal. There is no distension.     Palpations: Abdomen is soft. There is no  mass.     Tenderness: There is no abdominal tenderness. There is no right CVA tenderness, left CVA tenderness, guarding or rebound.     Hernia: No hernia is present.  Genitourinary:    Labia:        Right: No rash, tenderness, lesion or injury.        Left: No rash, tenderness, lesion or injury.      Vagina: Normal.     Cervix: Normal.     Uterus: Normal.      Adnexa: Right adnexa normal and left adnexa normal.  Musculoskeletal:        General: No swelling, tenderness, deformity or signs of injury.     Cervical back: Normal range of motion and neck supple. No rigidity. No muscular tenderness.     Right lower leg: No edema.     Left lower leg: No edema.  Lymphadenopathy:     Cervical: No cervical adenopathy.  Skin:    General: Skin is warm and dry.     Capillary Refill: Capillary refill takes less than 2 seconds.     Coloration: Skin is not jaundiced or pale.     Findings: No bruising, erythema, lesion or rash.  Neurological:     General: No focal deficit present.     Mental Status: She is alert and oriented to person, place, and time. Mental status is at baseline.     Cranial Nerves: No cranial nerve deficit.     Sensory: No sensory deficit.     Motor: No weakness.     Coordination: Coordination normal.     Gait: Gait normal.     Deep Tendon Reflexes: Reflexes normal.  Psychiatric:        Mood and Affect: Mood normal.        Behavior: Behavior normal.        Thought Content: Thought content normal.         Judgment: Judgment normal.     Results for orders placed or performed in visit on 05/28/22  Microscopic Examination   Urine  Result Value Ref Range   WBC, UA 0-5 0 - 5 /hpf   RBC, Urine 0-2 0 - 2 /hpf   Epithelial Cells (non renal) 0-10 0 - 10 /hpf   Mucus, UA Present (A) Not Estab.   Bacteria, UA None seen None seen/Few  Microalbumin, Urine Waived  Result Value Ref Range   Microalb, Ur Waived 30 (H) 0 - 19 mg/L   Creatinine, Urine Waived 200 10 - 300 mg/dL   Microalb/Creat Ratio <30 <30 mg/g  CBC with Differential/Platelet  Result Value Ref Range   WBC 4.7 3.4 - 10.8 x10E3/uL   RBC 4.76 3.77 - 5.28 x10E6/uL   Hemoglobin 11.1 11.1 - 15.9 g/dL   Hematocrit 02.5 42.7 - 46.6 %   MCV 76 (L) 79 - 97 fL   MCH 23.3 (L) 26.6 - 33.0 pg   MCHC 30.8 (L) 31.5 - 35.7 g/dL   RDW 06.2 37.6 - 28.3 %   Platelets 339 150 - 450 x10E3/uL   Neutrophils 65 Not Estab. %   Lymphs 23 Not Estab. %   Monocytes 9 Not Estab. %   Eos 3 Not Estab. %   Basos 0 Not Estab. %   Neutrophils Absolute 3.1 1.4 - 7.0 x10E3/uL   Lymphocytes Absolute 1.1 0.7 - 3.1 x10E3/uL   Monocytes Absolute 0.4 0.1 - 0.9 x10E3/uL   EOS (ABSOLUTE) 0.1 0.0 - 0.4 x10E3/uL   Basophils  Absolute 0.0 0.0 - 0.2 x10E3/uL   Immature Granulocytes 0 Not Estab. %   Immature Grans (Abs) 0.0 0.0 - 0.1 x10E3/uL  Comprehensive metabolic panel  Result Value Ref Range   Glucose 99 70 - 99 mg/dL   BUN 8 6 - 20 mg/dL   Creatinine, Ser 8.41 0.57 - 1.00 mg/dL   eGFR 660 >63 KZ/SWF/0.93   BUN/Creatinine Ratio 11 9 - 23   Sodium 138 134 - 144 mmol/L   Potassium 4.2 3.5 - 5.2 mmol/L   Chloride 99 96 - 106 mmol/L   CO2 26 20 - 29 mmol/L   Calcium 9.2 8.7 - 10.2 mg/dL   Total Protein 7.6 6.0 - 8.5 g/dL   Albumin 4.0 3.9 - 4.9 g/dL   Globulin, Total 3.6 1.5 - 4.5 g/dL   Albumin/Globulin Ratio 1.1 (L) 1.2 - 2.2   Bilirubin Total 0.8 0.0 - 1.2 mg/dL   Alkaline Phosphatase 90 44 - 121 IU/L   AST 20 0 - 40 IU/L   ALT 12 0 - 32 IU/L  Lipid  Panel w/o Chol/HDL Ratio  Result Value Ref Range   Cholesterol, Total 148 100 - 199 mg/dL   Triglycerides 235 0 - 149 mg/dL   HDL 29 (L) >57 mg/dL   VLDL Cholesterol Cal 24 5 - 40 mg/dL   LDL Chol Calc (NIH) 95 0 - 99 mg/dL  Urinalysis, Routine w reflex microscopic  Result Value Ref Range   Specific Gravity, UA 1.025 1.005 - 1.030   pH, UA 6.0 5.0 - 7.5   Color, UA Yellow Yellow   Appearance Ur Clear Clear   Leukocytes,UA 1+ (A) Negative   Protein,UA Trace (A) Negative/Trace   Glucose, UA Negative Negative   Ketones, UA Negative Negative   RBC, UA Negative Negative   Bilirubin, UA Negative Negative   Urobilinogen, Ur 1.0 0.2 - 1.0 mg/dL   Nitrite, UA Negative Negative   Microscopic Examination See below:   TSH  Result Value Ref Range   TSH 1.080 0.450 - 4.500 uIU/mL  Hgb A1c w/o eAG  Result Value Ref Range   Hgb A1c MFr Bld 6.0 (H) 4.8 - 5.6 %  HIV Antibody (routine testing w rflx)  Result Value Ref Range   HIV Screen 4th Generation wRfx Non Reactive Non Reactive  Hepatitis C Antibody  Result Value Ref Range   Hep C Virus Ab Non Reactive Non Reactive      Assessment & Plan:   Problem List Items Addressed This Visit       Cardiovascular and Mediastinum   Primary hypertension    Under good control on current regimen. Continue current regimen. Continue to monitor. Call with any concerns. Refills given. Labs drawn today.       Relevant Medications   amLODipine (NORVASC) 5 MG tablet   Other Relevant Orders   Basic metabolic panel     Digestive   Gastroesophageal reflux disease    Will start her on omeprazole. Call with any concerns. Continue to monitor.       Relevant Medications   omeprazole (PRILOSEC) 20 MG capsule   Other Visit Diagnoses     Routine general medical examination at a health care facility    -  Primary   Vaccines up to date. Screening labs checked today. Pap done. Continue diet and exercise. Call with any concerns. Continue to monitor.    Relevant Orders   Cytology - PAP        Follow up plan:  Return in about 6 months (around 12/30/2022).   LABORATORY TESTING:  - Pap smear: pap done  IMMUNIZATIONS:   - Tdap: Tetanus vaccination status reviewed: last tetanus booster within 10 years. - Influenza: Refused - Pneumovax: Not applicable - Prevnar: Not applicable - COVID: Up to date - HPV: Refused - Shingrix vaccine: Not applicable  PATIENT COUNSELING:   Advised to take 1 mg of folate supplement per day if capable of pregnancy.   Sexuality: Discussed sexually transmitted diseases, partner selection, use of condoms, avoidance of unintended pregnancy  and contraceptive alternatives.   Advised to avoid cigarette smoking.  I discussed with the patient that most people either abstain from alcohol or drink within safe limits (<=14/week and <=4 drinks/occasion for males, <=7/weeks and <= 3 drinks/occasion for females) and that the risk for alcohol disorders and other health effects rises proportionally with the number of drinks per week and how often a drinker exceeds daily limits.  Discussed cessation/primary prevention of drug use and availability of treatment for abuse.   Diet: Encouraged to adjust caloric intake to maintain  or achieve ideal body weight, to reduce intake of dietary saturated fat and total fat, to limit sodium intake by avoiding high sodium foods and not adding table salt, and to maintain adequate dietary potassium and calcium preferably from fresh fruits, vegetables, and low-fat dairy products.    stressed the importance of regular exercise  Injury prevention: Discussed safety belts, safety helmets, smoke detector, smoking near bedding or upholstery.   Dental health: Discussed importance of regular tooth brushing, flossing, and dental visits.    NEXT PREVENTATIVE PHYSICAL DUE IN 1 YEAR. Return in about 6 months (around 12/30/2022).

## 2022-07-01 NOTE — Assessment & Plan Note (Signed)
Under good control on current regimen. Continue current regimen. Continue to monitor. Call with any concerns. Refills given. Labs drawn today.

## 2022-07-01 NOTE — Assessment & Plan Note (Signed)
Will start her on omeprazole. Call with any concerns. Continue to monitor.

## 2022-07-02 LAB — BASIC METABOLIC PANEL
BUN/Creatinine Ratio: 14 (ref 9–23)
BUN: 11 mg/dL (ref 6–20)
CO2: 23 mmol/L (ref 20–29)
Calcium: 9.1 mg/dL (ref 8.7–10.2)
Chloride: 102 mmol/L (ref 96–106)
Creatinine, Ser: 0.79 mg/dL (ref 0.57–1.00)
Glucose: 93 mg/dL (ref 70–99)
Potassium: 4.2 mmol/L (ref 3.5–5.2)
Sodium: 139 mmol/L (ref 134–144)
eGFR: 102 mL/min/{1.73_m2} (ref >59)

## 2022-07-07 ENCOUNTER — Other Ambulatory Visit (HOSPITAL_BASED_OUTPATIENT_CLINIC_OR_DEPARTMENT_OTHER): Payer: Self-pay

## 2022-07-07 ENCOUNTER — Other Ambulatory Visit: Payer: Self-pay | Admitting: Family Medicine

## 2022-07-07 LAB — CYTOLOGY - PAP
Adequacy: ABSENT
Comment: NEGATIVE
Diagnosis: NEGATIVE
High risk HPV: NEGATIVE

## 2022-07-07 MED ORDER — METRONIDAZOLE 500 MG PO TABS
500.0000 mg | ORAL_TABLET | Freq: Two times a day (BID) | ORAL | 0 refills | Status: DC
  Filled 2022-07-07: qty 14, 7d supply, fill #0

## 2022-07-10 DIAGNOSIS — Z419 Encounter for procedure for purposes other than remedying health state, unspecified: Secondary | ICD-10-CM | POA: Diagnosis not present

## 2022-08-08 DIAGNOSIS — Z419 Encounter for procedure for purposes other than remedying health state, unspecified: Secondary | ICD-10-CM | POA: Diagnosis not present

## 2022-08-23 IMAGING — DX DG CHEST 1V PORT
1 series · 1 of 1 positions shown · non-contrast
Comparison: 10/17/2016

CLINICAL DATA: Shortness of breath and nausea

EXAM:
PORTABLE CHEST 1 VIEW

[chest ap]
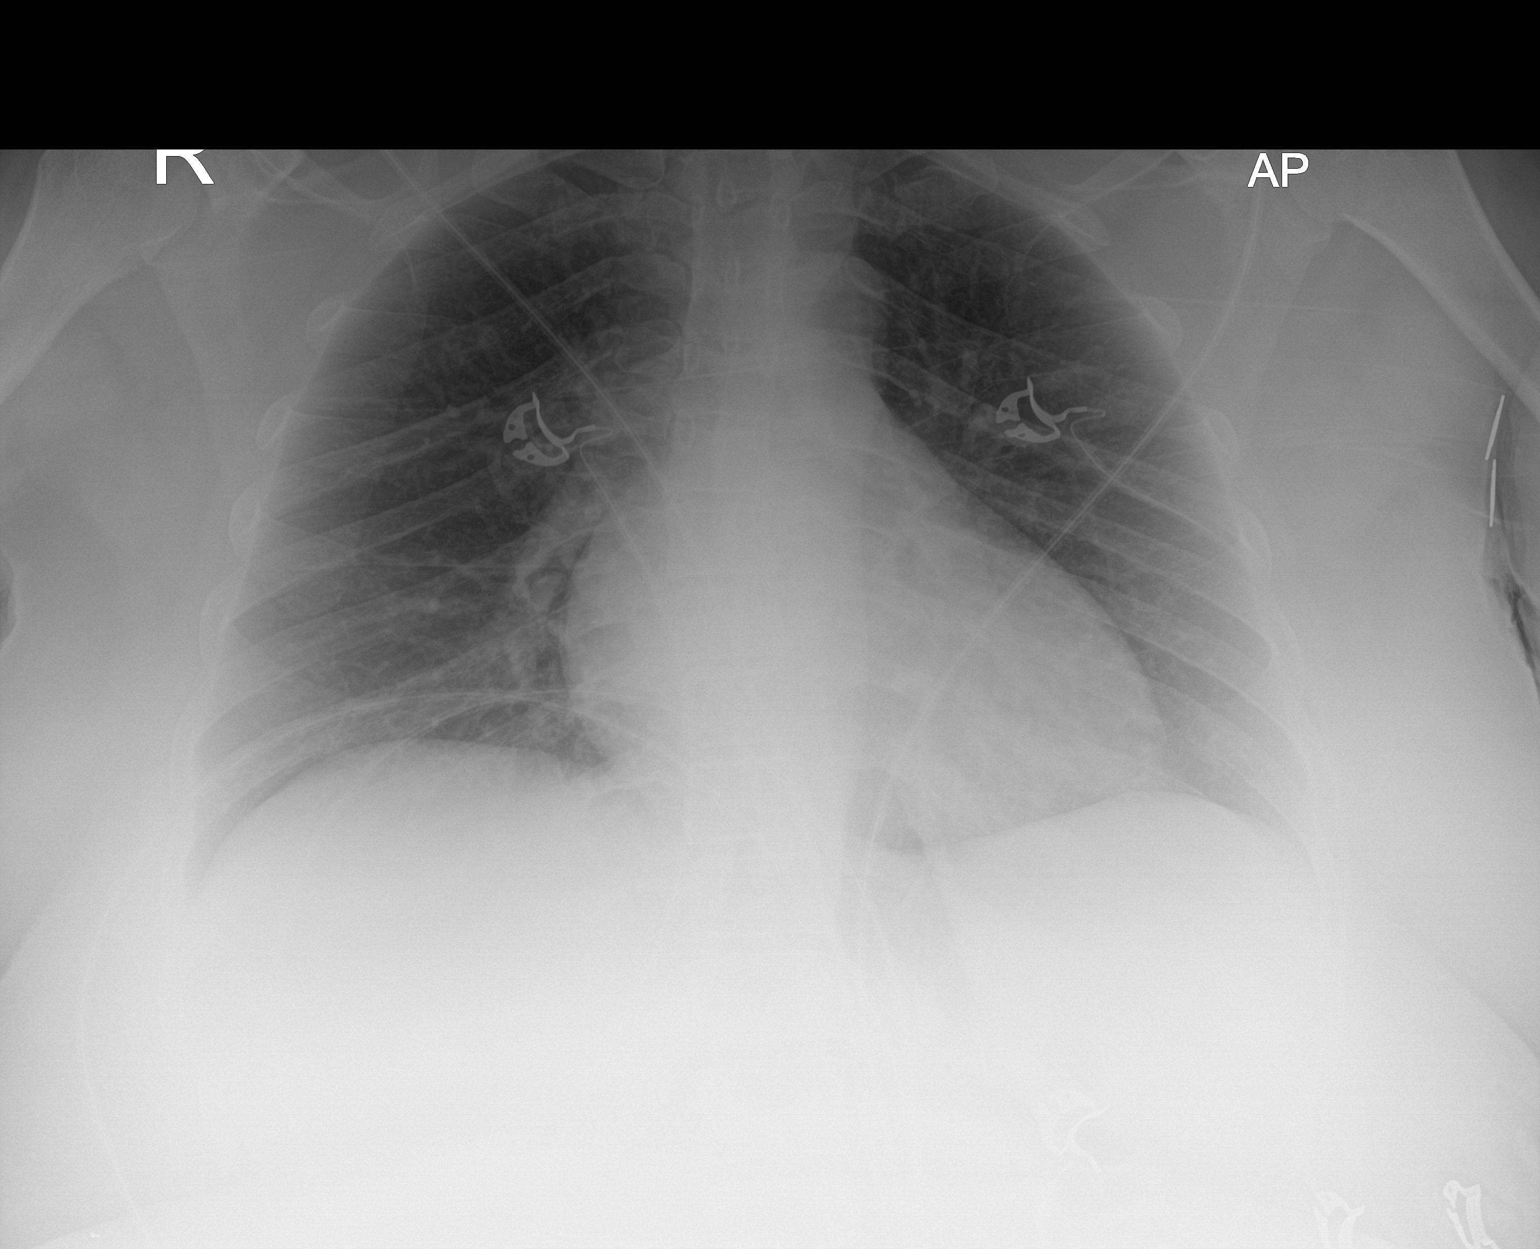

[1 of 1 positions shown; findings below may reference images not displayed]

FINDINGS: The heart size and mediastinal contours are within normal limits.
Both lungs are clear. The visualized skeletal structures are
unremarkable.
IMPRESSION: No active disease.

## 2022-09-08 DIAGNOSIS — Z419 Encounter for procedure for purposes other than remedying health state, unspecified: Secondary | ICD-10-CM | POA: Diagnosis not present

## 2022-10-08 DIAGNOSIS — Z419 Encounter for procedure for purposes other than remedying health state, unspecified: Secondary | ICD-10-CM | POA: Diagnosis not present

## 2022-11-05 ENCOUNTER — Ambulatory Visit: Payer: Self-pay | Admitting: Family Medicine

## 2022-11-08 DIAGNOSIS — Z419 Encounter for procedure for purposes other than remedying health state, unspecified: Secondary | ICD-10-CM | POA: Diagnosis not present

## 2022-12-08 DIAGNOSIS — Z419 Encounter for procedure for purposes other than remedying health state, unspecified: Secondary | ICD-10-CM | POA: Diagnosis not present

## 2023-01-06 ENCOUNTER — Encounter: Payer: Self-pay | Admitting: Family Medicine

## 2023-01-06 ENCOUNTER — Ambulatory Visit (INDEPENDENT_AMBULATORY_CARE_PROVIDER_SITE_OTHER): Payer: Medicaid Other | Admitting: Family Medicine

## 2023-01-06 VITALS — BP 116/78 | HR 81 | Ht 64.0 in | Wt 259.4 lb

## 2023-01-06 DIAGNOSIS — N3000 Acute cystitis without hematuria: Secondary | ICD-10-CM

## 2023-01-06 DIAGNOSIS — Z6841 Body Mass Index (BMI) 40.0 and over, adult: Secondary | ICD-10-CM | POA: Diagnosis not present

## 2023-01-06 DIAGNOSIS — I10 Essential (primary) hypertension: Secondary | ICD-10-CM | POA: Diagnosis not present

## 2023-01-06 DIAGNOSIS — R350 Frequency of micturition: Secondary | ICD-10-CM | POA: Diagnosis not present

## 2023-01-06 DIAGNOSIS — R7301 Impaired fasting glucose: Secondary | ICD-10-CM

## 2023-01-06 LAB — URINALYSIS, ROUTINE W REFLEX MICROSCOPIC
Bilirubin, UA: NEGATIVE
Glucose, UA: NEGATIVE
Ketones, UA: NEGATIVE
Nitrite, UA: NEGATIVE
Protein,UA: NEGATIVE
RBC, UA: NEGATIVE
Specific Gravity, UA: 1.02 (ref 1.005–1.030)
Urobilinogen, Ur: 1 mg/dL (ref 0.2–1.0)
pH, UA: 6.5 (ref 5.0–7.5)

## 2023-01-06 LAB — BAYER DCA HB A1C WAIVED: HB A1C (BAYER DCA - WAIVED): 5.8 % — ABNORMAL HIGH (ref 4.8–5.6)

## 2023-01-06 LAB — MICROSCOPIC EXAMINATION: Bacteria, UA: NONE SEEN

## 2023-01-06 MED ORDER — AMLODIPINE BESYLATE 5 MG PO TABS: 5.0000 mg | ORAL_TABLET | Freq: Every day | ORAL | 1 refills | Status: DC

## 2023-01-06 MED ORDER — NITROFURANTOIN MONOHYD MACRO 100 MG PO CAPS: 100.0000 mg | ORAL_CAPSULE | Freq: Two times a day (BID) | ORAL | 0 refills | Status: DC

## 2023-01-06 MED ORDER — OMEPRAZOLE 20 MG PO CPDR: 20.0000 mg | DELAYED_RELEASE_CAPSULE | Freq: Every day | ORAL | 1 refills | Status: DC

## 2023-01-06 NOTE — Assessment & Plan Note (Signed)
Improved with A1c of 5.8. Continue diet and exercise. Continue to monitor. Call with any concerns.

## 2023-01-06 NOTE — Assessment & Plan Note (Signed)
Under good control on current regimen. Continue current regimen. Continue to monitor. Call with any concerns. Refills given. Labs drawn today.   

## 2023-01-06 NOTE — Progress Notes (Signed)
BP 116/78   Pulse 81   Ht 5\' 4"  (1.626 m)   Wt 259 lb 6.4 oz (117.7 kg)   SpO2 99%   BMI 44.53 kg/m    Subjective:    Patient ID: Yesenia Andrade, female    DOB: 1991-04-08, 32 y.o.   MRN: [id number]  HPI: Yesenia Andrade is a 32 y.o. female  Chief Complaint  Patient presents with   Hypertension   HYPERTENSION  Hypertension status: controlled  Satisfied with current treatment? yes Duration of hypertension: chronic BP monitoring frequency:  not checking BP medication side effects:  no Medication compliance: excellent compliance Previous BP meds: amlodipine Aspirin: no Recurrent headaches: no Visual changes: no Palpitations: no Dyspnea: no Chest pain: no Lower extremity edema: no Dizzy/lightheaded: no  URINARY SYMPTOMS Duration: few days Dysuria: no Urinary frequency: yes Urgency: no Small volume voids: no Symptom severity: no Urinary incontinence: no Foul odor: no Hematuria: no Abdominal pain: no Back pain: no Suprapubic pain/pressure: no Flank pain: no Fever:  no Vomiting: no Relief with cranberry juice: no Relief with pyridium: no Status: stable Previous urinary tract infection: yes Recurrent urinary tract infection: no History of sexually transmitted disease: no Vaginal discharge: no Treatments attempted: none    Relevant past medical, surgical, family and social history reviewed and updated as indicated. Interim medical history since our last visit reviewed. Allergies and medications reviewed and updated.  Review of Systems  Constitutional: Negative.   Respiratory: Negative.    Cardiovascular: Negative.   Gastrointestinal: Negative.   Genitourinary:  Positive for frequency. Negative for decreased urine volume, difficulty urinating, dyspareunia, dysuria, enuresis, flank pain, genital sores, hematuria, menstrual problem, pelvic pain, urgency, vaginal bleeding, vaginal discharge and vaginal pain.  Musculoskeletal: Negative.    Psychiatric/Behavioral: Negative.      Per HPI unless specifically indicated above     Objective:    BP 116/78   Pulse 81   Ht 5\' 4"  (1.626 m)   Wt 259 lb 6.4 oz (117.7 kg)   SpO2 99%   BMI 44.53 kg/m   Wt Readings from Last 3 Encounters:  01/06/23 259 lb 6.4 oz (117.7 kg)  07/01/22 265 lb 6.4 oz (120.4 kg)  06/15/22 264 lb 8.8 oz (120 kg)    Physical Exam Vitals and nursing note reviewed.  Constitutional:      General: She is not in acute distress.    Appearance: Normal appearance. She is obese. She is not ill-appearing, toxic-appearing or diaphoretic.  HENT:     Head: Normocephalic and atraumatic.     Right Ear: External ear normal.     Left Ear: External ear normal.     Nose: Nose normal.     Mouth/Throat:     Mouth: Mucous membranes are moist.     Pharynx: Oropharynx is clear.  Eyes:     General: No scleral icterus.       Right eye: No discharge.        Left eye: No discharge.     Extraocular Movements: Extraocular movements intact.     Conjunctiva/sclera: Conjunctivae normal.     Pupils: Pupils are equal, round, and reactive to light.  Cardiovascular:     Rate and Rhythm: Normal rate and regular rhythm.     Pulses: Normal pulses.     Heart sounds: Normal heart sounds. No murmur heard.    No friction rub. No gallop.  Pulmonary:     Effort: Pulmonary effort is normal. No respiratory distress.  Breath sounds: Normal breath sounds. No stridor. No wheezing, rhonchi or rales.  Chest:     Chest wall: No tenderness.  Musculoskeletal:        General: Normal range of motion.     Cervical back: Normal range of motion and neck supple.  Skin:    General: Skin is warm and dry.     Capillary Refill: Capillary refill takes less than 2 seconds.     Coloration: Skin is not jaundiced or pale.     Findings: No bruising, erythema, lesion or rash.  Neurological:     General: No focal deficit present.     Mental Status: She is alert and oriented to person, place, and  time. Mental status is at baseline.  Psychiatric:        Mood and Affect: Mood normal.        Behavior: Behavior normal.        Thought Content: Thought content normal.        Judgment: Judgment normal.     Results for orders placed or performed in visit on 07/01/22  Basic metabolic panel  Result Value Ref Range   Glucose 93 70 - 99 mg/dL   BUN 11 6 - 20 mg/dL   Creatinine, Ser 1.66 0.57 - 1.00 mg/dL   eGFR 063 >01 SW/FUX/3.23   BUN/Creatinine Ratio 14 9 - 23   Sodium 139 134 - 144 mmol/L   Potassium 4.2 3.5 - 5.2 mmol/L   Chloride 102 96 - 106 mmol/L   CO2 23 20 - 29 mmol/L   Calcium 9.1 8.7 - 10.2 mg/dL  Cytology - PAP  Result Value Ref Range   High risk HPV Negative    Adequacy      Satisfactory for evaluation; transformation zone component ABSENT.   Diagnosis      - Negative for intraepithelial lesion or malignancy (NILM)   Microorganisms Shift in flora suggestive of bacterial vaginosis    Comment Normal Reference Range HPV - Negative       Assessment & Plan:   Problem List Items Addressed This Visit       Cardiovascular and Mediastinum   Primary hypertension - Primary    Under good control on current regimen. Continue current regimen. Continue to monitor. Call with any concerns. Refills given. Labs drawn today.       Relevant Medications   amLODipine (NORVASC) 5 MG tablet   Other Relevant Orders   Basic metabolic panel     Endocrine   IFG (impaired fasting glucose)    Improved with A1c of 5.8. Continue diet and exercise. Continue to monitor. Call with any concerns.        Other   BMI 40.0-44.9, adult [facility])    Congratulated patient on 6 lb weight loss. Continue diet and exercise. Call with any concerns.       Other Visit Diagnoses     Urinary frequency       Relevant Orders   Urinalysis, Routine w reflex microscopic   Bayer DCA Hb A1c Waived   Acute cystitis without hematuria       Will treat with macrobid. Send for culture. Call with any  concerns.   Relevant Orders   Urine Culture        Follow up plan: Return in about 6 months (around 07/09/2023).

## 2023-01-06 NOTE — Assessment & Plan Note (Signed)
Congratulated patient on 6 lb weight loss. Continue diet and exercise. Call with any concerns.

## 2023-01-08 DIAGNOSIS — Z419 Encounter for procedure for purposes other than remedying health state, unspecified: Secondary | ICD-10-CM | POA: Diagnosis not present

## 2023-02-08 DIAGNOSIS — Z419 Encounter for procedure for purposes other than remedying health state, unspecified: Secondary | ICD-10-CM | POA: Diagnosis not present

## 2023-07-14 ENCOUNTER — Ambulatory Visit: Payer: Self-pay | Admitting: Family Medicine

## 2023-10-02 ENCOUNTER — Emergency Department: Payer: Self-pay

## 2023-10-02 ENCOUNTER — Other Ambulatory Visit: Payer: Self-pay

## 2023-10-02 ENCOUNTER — Emergency Department
Admission: EM | Admit: 2023-10-02 | Discharge: 2023-10-02 | Disposition: A | Discharge status: Home / Self Care | Payer: Self-pay | Attending: Emergency Medicine | Admitting: Emergency Medicine

## 2023-10-02 DIAGNOSIS — D509 Iron deficiency anemia, unspecified: Secondary | ICD-10-CM | POA: Insufficient documentation

## 2023-10-02 DIAGNOSIS — I10 Essential (primary) hypertension: Secondary | ICD-10-CM | POA: Insufficient documentation

## 2023-10-02 DIAGNOSIS — R002 Palpitations: Secondary | ICD-10-CM | POA: Insufficient documentation

## 2023-10-02 DIAGNOSIS — R5383 Other fatigue: Secondary | ICD-10-CM | POA: Insufficient documentation

## 2023-10-02 LAB — CBC
HCT: 29.2 % — ABNORMAL LOW (ref 36.0–46.0)
Hemoglobin: 8 g/dL — ABNORMAL LOW (ref 12.0–15.0)
MCH: 19.3 pg — ABNORMAL LOW (ref 26.0–34.0)
MCHC: 27.4 g/dL — ABNORMAL LOW (ref 30.0–36.0)
MCV: 70.5 fL — ABNORMAL LOW (ref 80.0–100.0)
Platelets: 376 10*3/uL (ref 150–400)
RBC: 4.14 MIL/uL (ref 3.87–5.11)
RDW: 17.1 % — ABNORMAL HIGH (ref 11.5–15.5)
WBC: 5.7 10*3/uL (ref 4.0–10.5)
nRBC: 0 % (ref 0.0–0.2)

## 2023-10-02 LAB — BASIC METABOLIC PANEL WITH GFR
Anion gap: 8 (ref 5–15)
BUN: 12 mg/dL (ref 6–20)
CO2: 23 mmol/L (ref 22–32)
Calcium: 8.7 mg/dL — ABNORMAL LOW (ref 8.9–10.3)
Chloride: 105 mmol/L (ref 98–111)
Creatinine, Ser: 0.96 mg/dL (ref 0.44–1.00)
GFR, Estimated: >60 mL/min (ref >60)
Glucose, Bld: 117 mg/dL — ABNORMAL HIGH (ref 70–99)
Potassium: 3.6 mmol/L (ref 3.5–5.1)
Sodium: 136 mmol/L (ref 135–145)

## 2023-10-02 LAB — TROPONIN I (HIGH SENSITIVITY): Troponin I (High Sensitivity): 6 ng/L (ref <18)

## 2023-10-02 LAB — POC URINE PREG, ED: Preg Test, Ur: NEGATIVE

## 2023-10-02 MED ORDER — IRON (FERROUS SULFATE) 325 (65 FE) MG PO TABS: 1.0000 | ORAL_TABLET | Freq: Every day | ORAL | 2 refills | Status: DC

## 2023-10-02 NOTE — ED Triage Notes (Signed)
 Pt to ED via POV c/o palpitations and SHOB that started about an hr ago. Palpitations have calmed down since arriving. Pt feels shaky and dizzy. Denies CP. Reports this has happened once before. Hx HTN

## 2023-10-02 NOTE — Discharge Instructions (Addendum)
 Take iron supplement as prescribed. See your primary doctor for follow-up appointment to recheck your blood levels within the next 1 to 2 weeks.  Call Dr. Alvia Awkward of gynecology to talk about your heavy menstrual bleeding and treatment options for reducing the amount of bleeding you have during your period  Thank you for choosing us  for your health care today!  Please see your primary doctor this week for a follow up appointment.   If you have any new, worsening, or unexpected symptoms call your doctor right away or come back to the emergency department for reevaluation.  It was my pleasure to care for you today.   Arron Large Margery Sheets, MD

## 2023-10-02 NOTE — ED Provider Notes (Signed)
 [facility] Provider Note    Event Date/Time   First MD Initiated Contact with Patient 10/02/23 [phone]     (approximate)   History   Palpitations   HPI  Yesenia Andrade is a 33 y.o. female   Past medical history of anemia during pregnancy, GERD, hypertension who presents to Emergency Department with several weeks of fatigue, lightheadedness, and the sense of palpitations today.  She has had heavier menstrual periods than normal.  She also notes that she has been dieting "calorie deficit" to try to lose weight.  She does not take any dietary supplements.  She is eating a variety of foods just decreasing significantly the calorie intake to try to lose weight.  She denies any active bleeding.  She is not on her period.  She denies GI bleeding.  She has no chest pain or abdominal pain.  She has no shortness of breath at rest but feels more quickly short winded after exertion than she normally does.  Independent Historian contributed to assessment above: Husband is at bedside to corroborate information past medical history as above    Physical Exam   Triage Vital Signs: ED Triage Vitals  Encounter Vitals Group     BP 10/02/23 0134 (!) 153/87     Systolic BP Percentile --      Diastolic BP Percentile --      Pulse Rate 10/02/23 0134 (!) 112     Resp 10/02/23 0134 20     Temp 10/02/23 0134 98.4 F (36.9 C)     Temp Source 10/02/23 0134 Oral     SpO2 10/02/23 0134 100 %     Weight 10/02/23 0132 268 lb (121.6 kg)     Height 10/02/23 0132 5\' 4"  (1.626 m)     Head Circumference --      Peak Flow --      Pain Score 10/02/23 0132 0     Pain Loc --      Pain Education --      Exclude from Growth Chart --     Most recent vital signs: Vitals:   10/02/23 0134 10/02/23 0337  BP: (!) 153/87   Pulse: (!) 112 90  Resp: 20   Temp: 98.4 F (36.9 C)   SpO2: 100%     General: Awake, no distress.  CV:  Good peripheral perfusion.  Resp:  Normal effort.   Abd:  No distention.  Other:  Initially tachycardic in triage but now in the low 90s at rest without intervention.  Slightly hypertensive.  Afebrile.  Soft nontender abdomen deep palpation all quadrants, clear lungs to auscultation   ED Results / Procedures / Treatments   Labs (all labs ordered are listed, but only abnormal results are displayed) Labs Reviewed  CBC - Abnormal; Notable for the following components:      Result Value   Hemoglobin 8.0 (*)    HCT 29.2 (*)    MCV 70.5 (*)    MCH 19.3 (*)    MCHC 27.4 (*)    RDW 17.1 (*)    All other components within normal limits  BASIC METABOLIC PANEL WITH GFR - Abnormal; Notable for the following components:   Glucose, Bld 117 (*)    Calcium 8.7 (*)    All other components within normal limits  POC URINE PREG, ED  TROPONIN I (HIGH SENSITIVITY)     I ordered and reviewed the above labs they are notable for microcytic anemia.  Not  pregnant.  Troponin normal.  EKG  ED ECG REPORT I, Buell Carmin, the attending physician, personally viewed and interpreted this ECG.   Date: 10/02/2023  EKG Time: 0136  Rate: 103  Rhythm: sinus tachycardia  Axis: nl  Intervals:nl  ST&T Change: no stemi    RADIOLOGY I independently reviewed and interpreted chest x-ray and I see no obvious focality or pneumothorax I also reviewed radiologist's formal read.   PROCEDURES:  Critical Care performed: No  Procedures   MEDICATIONS ORDERED IN ED: Medications - No data to display   IMPRESSION / MDM / ASSESSMENT AND PLAN / ED COURSE  I reviewed the triage vital signs and the nursing notes.                                Patient's presentation is most consistent with acute presentation with potential threat to life or bodily function.  Differential diagnosis includes, but is not limited to, iron deficiency anemia, blood loss anemia, electrolyte derangements, dehydration, infection, ACS, PE   The patient is on the cardiac monitor to  evaluate for evidence of arrhythmia and/or significant heart rate changes.  MDM:    I think her  constellation of symptoms are reflective of her anemia, microcytic in nature, probably a combination of both her heavy menstrual bleeding and her recent diet restrictions leading to both a chronic blood loss plus production issue.  She has no active bleeding currently.  She is slightly tachycardic today but has a normal rate at rest and is hemodynamically stable.  I think her exertional short of breath is also a symptom of her anemia, and not reflective of cardiopulmonary emergencies like ACS or PE.  She does not need a blood transfusion today given that she is 8.0 with no history of CAD, no active bleeding.  I will start her on iron supplement.  I will give her phone number to call for gynecology.  She has a primary doctor she can schedule a close follow-up and recheck of blood testing.  She understands reasons to return with any new or worsening symptoms.        FINAL CLINICAL IMPRESSION(S) / ED DIAGNOSES   Final diagnoses:  Microcytic anemia  Other fatigue  Palpitations     Rx / DC Orders   ED Discharge Orders          Ordered    Iron, Ferrous Sulfate, 325 (65 Fe) MG TABS  Daily        10/02/23 0336             Note:  This document was prepared using Dragon voice recognition software and may include unintentional dictation errors.    Buell Carmin, MD 10/02/23 [phone]

## 2024-03-01 ENCOUNTER — Encounter: Payer: Self-pay | Admitting: Family Medicine

## 2024-03-04 NOTE — Telephone Encounter (Signed)
 I have added him to the schedule for Wed 10/1 at 11:20

## 2024-05-21 ENCOUNTER — Other Ambulatory Visit: Payer: Self-pay | Admitting: Family Medicine

## 2024-05-24 NOTE — Telephone Encounter (Signed)
 Last OV 01/06/23- expired Rx- needs appointment Requested Prescriptions  Pending Prescriptions Disp Refills   amLODipine  (NORVASC ) 5 MG tablet [Pharmacy Med Name: AMLODIPINE  BESYLATE 5 MG TAB] 90 tablet 1    Sig: TAKE 1 TABLET (5 MG TOTAL) BY MOUTH DAILY.     Cardiovascular: Calcium Channel Blockers 2 Failed - 05/24/2024  2:31 PM      Failed - Valid encounter within last 6 months    Recent Outpatient Visits   None            Passed - Last BP in normal range    BP Readings from Last 1 Encounters:  10/02/23 116/71         Passed - Last Heart Rate in normal range    Pulse Readings from Last 1 Encounters:  10/02/23 90

## 2024-06-13 ENCOUNTER — Ambulatory Visit: Payer: Self-pay

## 2024-06-13 NOTE — Telephone Encounter (Signed)
 Scheduled for med refill for HTN with increased headaches. Appt is 06/16/2024.

## 2024-06-13 NOTE — Telephone Encounter (Signed)
 FYI Only or Action Required?: FYI only for provider: appointment scheduled on 1/8.  Patient was last seen in primary care on 01/06/2023 by Vicci Bouchard P, DO.  Called Nurse Triage reporting Headache.  Symptoms began several days ago.  Interventions attempted: OTC medications: Tylenol  and Rest, hydration, or home remedies.  Symptoms are: Improved, but out of medication. Does not have BP med or iron  medication.   Triage Disposition: See PCP Within 2 Weeks  Patient/caregiver understands and will follow disposition?: Yes  Also please be aware: Reports having a new onset allergic reaction to walnuts last week. Was not previously allergic as a child and certain it was a reaction to walnuts. Symptoms included lips/tongue tingling, throat swelling. Called EMS but did not got ED. Took Benadryl and improved. Denies any known exposure to mold that is visible in home. Did have a leaky sink that was fixed the same day. Unknown if area was fully dried out. Otherwise denies any other flooding or known humidity issue.     Copied from CRM #[id number]. Topic: Clinical - Red Word Triage >> Jun 13, 2024  2:03 PM Winona R wrote: Pt out of BP medication dn would like to resch her appointment but she has experienced an increase of headaches due to not having her medication.   Reason for Disposition  Headache is a chronic symptom (recurrent or ongoing AND present > 4 weeks)  Answer Assessment - Initial Assessment Questions 1. LOCATION: Where does it hurt?      No current pain.  Recently having headache right over eyes.   2. ONSET: When did the headache start? (e.g., minutes, hours, days)      2 days ago.  3. PATTERN: Does the pain come and go, or has it been constant since it started?     Constant when having the headache.    4. SEVERITY: How bad is the pain? and What does it keep you from doing?  (e.g., Scale 1-10; mild, moderate, or severe)     7/10 yesterday, no pain today.  Took Tylenol   which was effective.   5. RECURRENT SYMPTOM: Have you ever had headaches before? If Yes, ask: When was the last time? and What happened that time?      Yes, with menstrual cycle, low iron , elevated BPs.  Uncertain if due to BP at this time, but recently having menstrual cycle.   6. CAUSE: What do you think is causing the headache?     Iron  and BP medication expired, out of medication. Relates to both.  Aware needs to be seen for refills.   7. MIGRAINE: Have you been diagnosed with migraine headaches? If Yes, ask: Is this headache similar?      Denies  8. HEAD INJURY: Has there been any recent injury to your head?      Denies  9. OTHER SYMPTOMS: Do you have any other symptoms? (e.g., fever, stiff neck, eye pain, sore throat, cold symptoms)     Denies any vision changes, dizzy or light-headed.  Denies chest pain. Denies shortness of breath. Denies any congestion, runny nose or postnasal drip. Denies fever, chills or vomiting.  Occasional nausea with headache.   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  10. PREGNANCY: Is there any chance you are pregnant? When was your last menstrual period?       Has headaches around time of month. LMP was 3 days ago.  Reports having a new onset allergic reaction to  walnuts last week.  Was not previously allergic as a child and certain it was a reaction to walnuts.  Symptoms included lips/tongue tingling, throat swelling.  Called EMS but did not got ED.  Took Benadryl and improved.  Denies any known exposure to mold that is visible in home.  Did have a leaky sink that was fixed the same day.  Unknown if area was fully dried out.  Otherwise denies any other flooding or known humidity issue.  Protocols used: [facility]

## 2024-06-16 ENCOUNTER — Ambulatory Visit (INDEPENDENT_AMBULATORY_CARE_PROVIDER_SITE_OTHER): Admitting: Nurse Practitioner

## 2024-06-16 ENCOUNTER — Encounter: Payer: Self-pay | Admitting: Nurse Practitioner

## 2024-06-16 VITALS — BP 129/83 | HR 96 | Temp 98.3°F | Ht 64.02 in | Wt 260.6 lb

## 2024-06-16 DIAGNOSIS — I10 Essential (primary) hypertension: Secondary | ICD-10-CM

## 2024-06-16 DIAGNOSIS — D508 Other iron deficiency anemias: Secondary | ICD-10-CM

## 2024-06-16 MED ORDER — AMLODIPINE BESYLATE 5 MG PO TABS: 5.0000 mg | ORAL_TABLET | Freq: Every day | ORAL | 0 refills | Status: AC

## 2024-06-16 MED ORDER — IRON (FERROUS SULFATE) 325 (65 FE) MG PO TABS: 1.0000 | ORAL_TABLET | Freq: Every day | ORAL | 2 refills | Status: AC

## 2024-06-16 MED ORDER — OMEPRAZOLE 20 MG PO CPDR: 20.0000 mg | DELAYED_RELEASE_CAPSULE | Freq: Every day | ORAL | 0 refills | Status: AC

## 2024-06-16 NOTE — Progress Notes (Signed)
 "  BP 129/83 (BP Location: Right Arm, Patient Position: Sitting, Cuff Size: Large)   Pulse 96   Temp 98.3 F (36.8 C) (Oral)   Ht 5' 4.02 (1.626 m)   Wt 260 lb 9.6 oz (118.2 kg)   LMP 06/10/2024 (Approximate)   SpO2 99%   BMI 44.71 kg/m    Subjective:    Patient ID: Yesenia Andrade, female    DOB: 29-Nov-1990, 34 y.o.   MRN: [id number]  HPI: Yesenia Andrade is a 34 y.o. female  Chief Complaint  Patient presents with   Headache   Medication Management   HYPERTENSION without Chronic Kidney Disease Patient states she always has headaches around her menstrual cycle and lasts a couple of days.   Hypertension status: uncontrolled  Satisfied with current treatment? yes Duration of hypertension: years BP monitoring frequency:  not checking BP range:  BP medication side effects:  no Medication compliance: excellent compliance Previous BP meds:amlodipine  Aspirin: no Recurrent headaches: yes Visual changes: no Palpitations: no Dyspnea: no Chest pain: no Lower extremity edema: no Dizzy/lightheaded: no  ANEMIA Anemia status: uncontrolled Etiology of anemia: Duration of anemia treatment:  Compliance with treatment: excellent compliance Iron  supplementation side effects: yes Severity of anemia: mild Fatigue: no Decreased exercise tolerance: no  Dyspnea on exertion: no Palpitations: no Bleeding: no Pica: no  Patient had a lapse in insurance which is why she hasn't been into the office for awhile.  She was trying to make her medication last but she has been out for a couple of weeks.   Relevant past medical, surgical, family and social history reviewed and updated as indicated. Interim medical history since our last visit reviewed. Allergies and medications reviewed and updated.  Review of Systems  Constitutional:  Negative for fatigue.  Eyes:  Negative for visual disturbance.  Respiratory:  Negative for cough, chest tightness and shortness of breath.    Cardiovascular:  Negative for chest pain, palpitations and leg swelling.  Neurological:  Positive for headaches. Negative for dizziness.    Per HPI unless specifically indicated above     Objective:    BP 129/83 (BP Location: Right Arm, Patient Position: Sitting, Cuff Size: Large)   Pulse 96   Temp 98.3 F (36.8 C) (Oral)   Ht 5' 4.02 (1.626 m)   Wt 260 lb 9.6 oz (118.2 kg)   LMP 06/10/2024 (Approximate)   SpO2 99%   BMI 44.71 kg/m   Wt Readings from Last 3 Encounters:  06/16/24 260 lb 9.6 oz (118.2 kg)  10/02/23 268 lb (121.6 kg)  01/06/23 259 lb 6.4 oz (117.7 kg)    Physical Exam Vitals and nursing note reviewed.  Constitutional:      General: She is not in acute distress.    Appearance: Normal appearance. She is well-developed. She is not ill-appearing, toxic-appearing or diaphoretic.  HENT:     Head: Normocephalic.     Right Ear: External ear normal.     Left Ear: External ear normal.     Nose: Nose normal.     Mouth/Throat:     Mouth: Mucous membranes are moist.     Pharynx: Oropharynx is clear.  Eyes:     General:        Right eye: No discharge.        Left eye: No discharge.     Extraocular Movements: Extraocular movements intact.     Conjunctiva/sclera: Conjunctivae normal.     Pupils: Pupils are equal, round, and reactive to light.  Cardiovascular:     Rate and Rhythm: Normal rate and regular rhythm.     Heart sounds: No murmur heard. Pulmonary:     Effort: Pulmonary effort is normal. No respiratory distress.     Breath sounds: Normal breath sounds. No wheezing or rales.  Musculoskeletal:     Cervical back: Normal range of motion and neck supple.  Skin:    General: Skin is warm and dry.     Capillary Refill: Capillary refill takes less than 2 seconds.  Neurological:     General: No focal deficit present.     Mental Status: She is alert and oriented to person, place, and time. Mental status is at baseline.  Psychiatric:        Mood and Affect:  Mood normal.        Behavior: Behavior normal.        Thought Content: Thought content normal.        Judgment: Judgment normal.     Results for orders placed or performed during the hospital encounter of 10/02/23  CBC   Collection Time: 10/02/23  1:35 AM  Result Value Ref Range   WBC 5.7 4.0 - 10.5 K/uL   RBC 4.14 3.87 - 5.11 MIL/uL   Hemoglobin 8.0 (L) 12.0 - 15.0 g/dL   HCT 70.7 (L) 63.9 - 53.9 %   MCV 70.5 (L) 80.0 - 100.0 fL   MCH 19.3 (L) 26.0 - 34.0 pg   MCHC 27.4 (L) 30.0 - 36.0 g/dL   RDW 82.8 (H) 88.4 - 84.4 %   Platelets 376 150 - 400 K/uL   nRBC 0.0 0.0 - 0.2 %  Basic metabolic panel   Collection Time: 10/02/23  1:35 AM  Result Value Ref Range   Sodium 136 135 - 145 mmol/L   Potassium 3.6 3.5 - 5.1 mmol/L   Chloride 105 98 - 111 mmol/L   CO2 23 22 - 32 mmol/L   Glucose, Bld 117 (H) 70 - 99 mg/dL   BUN 12 6 - 20 mg/dL   Creatinine, Ser 9.03 0.44 - 1.00 mg/dL   Calcium 8.7 (L) 8.9 - 10.3 mg/dL   GFR, Estimated >39 >39 mL/min   Anion gap 8 5 - 15  Troponin I (High Sensitivity)   Collection Time: 10/02/23  1:35 AM  Result Value Ref Range   Troponin I (High Sensitivity) 6 <18 ng/L  POC urine preg, ED (not at [facility])   Collection Time: 10/02/23  1:45 AM  Result Value Ref Range   Preg Test, Ur NEGATIVE NEGATIVE      Assessment & Plan:   Problem List Items Addressed This Visit       Cardiovascular and Mediastinum   Primary hypertension   Chronic. Not well controlled.  Suspect headache is related to elevated blood pressure.  Will restart Amlodipine .  Follow up with PCP in 6 weeks.  Will check labs at next visit.        Relevant Medications   amLODipine  (NORVASC ) 5 MG tablet   Other Visit Diagnoses       Iron  deficiency anemia secondary to inadequate dietary iron  intake    -  Primary   Anemia panel checked today.  Will make recommendations based on results.   Relevant Medications   Iron , Ferrous Sulfate , 325 (65 Fe) MG TABS   Other Relevant Orders    Anemia Profile B        Follow up plan: Return in about 6 weeks (around 07/28/2024) for Physical  and Fasting labs.      "

## 2024-06-16 NOTE — Assessment & Plan Note (Signed)
 Chronic. Not well controlled.  Suspect headache is related to elevated blood pressure.  Will restart Amlodipine .  Follow up with PCP in 6 weeks.  Will check labs at next visit.

## 2024-06-17 ENCOUNTER — Telehealth: Payer: Self-pay

## 2024-06-17 ENCOUNTER — Other Ambulatory Visit: Payer: Self-pay

## 2024-06-17 ENCOUNTER — Ambulatory Visit: Payer: Self-pay | Admitting: Nurse Practitioner

## 2024-06-17 ENCOUNTER — Emergency Department: Admission: EM | Admit: 2024-06-17 | Discharge: 2024-06-17 | Disposition: A | Discharge status: Home / Self Care

## 2024-06-17 DIAGNOSIS — I10 Essential (primary) hypertension: Secondary | ICD-10-CM | POA: Diagnosis not present

## 2024-06-17 DIAGNOSIS — D649 Anemia, unspecified: Secondary | ICD-10-CM | POA: Insufficient documentation

## 2024-06-17 DIAGNOSIS — R519 Headache, unspecified: Secondary | ICD-10-CM | POA: Diagnosis present

## 2024-06-17 DIAGNOSIS — D500 Iron deficiency anemia secondary to blood loss (chronic): Secondary | ICD-10-CM

## 2024-06-17 LAB — COMPREHENSIVE METABOLIC PANEL WITH GFR
ALT: 16 U/L (ref 0–44)
AST: 20 U/L (ref 15–41)
Albumin: 3.9 g/dL (ref 3.5–5.0)
Alkaline Phosphatase: 95 U/L (ref 38–126)
Anion gap: 8 (ref 5–15)
BUN: 9 mg/dL (ref 6–20)
CO2: 26 mmol/L (ref 22–32)
Calcium: 9.2 mg/dL (ref 8.9–10.3)
Chloride: 108 mmol/L (ref 98–111)
Creatinine, Ser: 0.73 mg/dL (ref 0.44–1.00)
GFR, Estimated: >60 mL/min
Glucose, Bld: 108 mg/dL — ABNORMAL HIGH (ref 70–99)
Potassium: 4.2 mmol/L (ref 3.5–5.1)
Sodium: 142 mmol/L (ref 135–145)
Total Bilirubin: 0.6 mg/dL (ref 0.0–1.2)
Total Protein: 8.4 g/dL — ABNORMAL HIGH (ref 6.5–8.1)

## 2024-06-17 LAB — ANEMIA PROFILE B
Basophils Absolute: 0 x10E3/uL (ref 0.0–0.2)
Basos: 0 %
EOS (ABSOLUTE): 0.1 x10E3/uL (ref 0.0–0.4)
Eos: 3 %
Ferritin: 5 ng/mL — ABNORMAL LOW (ref 15–150)
Folate: 3.7 ng/mL
Hematocrit: 25.5 % — ABNORMAL LOW (ref 34.0–46.6)
Hemoglobin: 6.8 g/dL — CL (ref 11.1–15.9)
Immature Grans (Abs): 0 x10E3/uL (ref 0.0–0.1)
Immature Granulocytes: 0 %
Iron Saturation: 4 % — CL (ref 15–55)
Iron: 14 ug/dL — ABNORMAL LOW (ref 27–159)
Lymphocytes Absolute: 1.1 x10E3/uL (ref 0.7–3.1)
Lymphs: 23 %
MCH: 17.6 pg — ABNORMAL LOW (ref 26.6–33.0)
MCHC: 26.7 g/dL — ABNORMAL LOW (ref 31.5–35.7)
MCV: 66 fL — ABNORMAL LOW (ref 79–97)
Monocytes Absolute: 0.5 x10E3/uL (ref 0.1–0.9)
Monocytes: 10 %
Neutrophils Absolute: 3 x10E3/uL (ref 1.4–7.0)
Neutrophils: 64 %
Platelets: 337 x10E3/uL (ref 150–450)
RBC: 3.86 x10E6/uL (ref 3.77–5.28)
RDW: 15.6 % — ABNORMAL HIGH (ref 11.7–15.4)
Retic Ct Pct: 1.7 % (ref 0.6–2.6)
Total Iron Binding Capacity: 352 ug/dL (ref 250–450)
UIBC: 338 ug/dL (ref 131–425)
Vitamin B-12: 563 pg/mL (ref 232–1245)
WBC: 4.7 x10E3/uL (ref 3.4–10.8)

## 2024-06-17 LAB — CBC
HCT: 24.5 % — ABNORMAL LOW (ref 36.0–46.0)
Hemoglobin: 6.3 g/dL — ABNORMAL LOW (ref 12.0–15.0)
MCH: 17.5 pg — ABNORMAL LOW (ref 26.0–34.0)
MCHC: 25.7 g/dL — ABNORMAL LOW (ref 30.0–36.0)
MCV: 68.1 fL — ABNORMAL LOW (ref 80.0–100.0)
Platelets: 318 K/uL (ref 150–400)
RBC: 3.6 MIL/uL — ABNORMAL LOW (ref 3.87–5.11)
RDW: 17.2 % — ABNORMAL HIGH (ref 11.5–15.5)
WBC: 4.4 K/uL (ref 4.0–10.5)
nRBC: 0 % (ref 0.0–0.2)

## 2024-06-17 LAB — ABO/RH: ABO/RH(D): O POS

## 2024-06-17 LAB — PREPARE RBC (CROSSMATCH)

## 2024-06-17 MED ORDER — SODIUM CHLORIDE 0.9% IV SOLUTION
Freq: Once | INTRAVENOUS | Status: AC
  Filled 2024-06-17: qty 250

## 2024-06-17 NOTE — ED Provider Notes (Signed)
 I was told during signout that patient was a discharge hold pending patient receiving a unit of blood.  However discharge order had not been placed and nurse alerted me that the blood had finished.  Reevaluated patient she denies any symptoms reports feeling improved.  She states that she was just given a prescription to start on iron .  She denies any active vaginal bleeding or active rectal bleeding she is following up with hematology outpatient.  She feels comfortable with discharge home.   Ernest Ronal BRAVO, MD 06/17/24 [phone]

## 2024-06-17 NOTE — Telephone Encounter (Signed)
 Patient is in the ER

## 2024-06-17 NOTE — Telephone Encounter (Signed)
 Copied from CRM #[id number]. Topic: General - Other >> Jun 17, 2024  9:56 AM Sophia H wrote: Reason for CRM: [facility], needing to speak with someone regarding urgent referral that was received for the patient. Please reach out # [phone]  ** Dr has reviewed it and believes the patient is more appropriate for the ER because she is needing a transfusion ASAP and they are unable to accommodate till Monday.

## 2024-06-17 NOTE — Discharge Instructions (Addendum)
 Return to the ER if develop worsening lightheadedness, shortness of breath or any other concerns.  Important you start the iron  medications and follow-up with hematology and if you any bleeding you should return to the ER for recheck of hemoglobin.

## 2024-06-17 NOTE — ED Provider Notes (Signed)
 "  [facility] Provider Note    Event Date/Time   First MD Initiated Contact with Patient 06/17/24 1145     (approximate)   History   Abnormal Lab   HPI  Yesenia Andrade is a 34 y.o. female with a past medical history of hypertension, GERD, anemia, presenting to the emergency department for abnormal labs.  The patient reports that she had told her PCP which she was having headaches and shortness of breath on exertion with fatigue and they performed outpatient lab work which showed hemoglobin of 6.8.  She was told that she needed to come to the ER for transfusion.  She reports that when she is sitting in the bed she is asymptomatic but that when she walks around she feels short of breath and dizzy and it makes her very tired.  Denies any melena, hematochezia.     Physical Exam   Triage Vital Signs: ED Triage Vitals  Encounter Vitals Group     BP 06/17/24 1136 138/85     Girls Systolic BP Percentile --      Girls Diastolic BP Percentile --      Boys Systolic BP Percentile --      Boys Diastolic BP Percentile --      Pulse Rate 06/17/24 1136 89     Resp 06/17/24 1136 16     Temp 06/17/24 1136 98.5 F (36.9 C)     Temp Source 06/17/24 1136 Oral     SpO2 06/17/24 1136 100 %     Weight 06/17/24 1137 260 lb (117.9 kg)     Height 06/17/24 1137 5' 4 (1.626 m)     Head Circumference --      Peak Flow --      Pain Score 06/17/24 1137 0     Pain Loc --      Pain Education --      Exclude from Growth Chart --     Most recent vital signs: Vitals:   06/17/24 1136  BP: 138/85  Pulse: 89  Resp: 16  Temp: 98.5 F (36.9 C)  SpO2: 100%     General: Awake, no distress.  CV:  Good peripheral perfusion.  Resp:  Normal effort.  Abd:  No distention.  Other:     ED Results / Procedures / Treatments   Labs (all labs ordered are listed, but only abnormal results are displayed) Labs Reviewed  COMPREHENSIVE METABOLIC PANEL WITH GFR  CBC  POC URINE  PREG, ED  TYPE AND SCREEN     EKG     RADIOLOGY     PROCEDURES:  Critical Care performed: No  Procedures   MEDICATIONS ORDERED IN ED: Medications - No data to display   IMPRESSION / MDM / ASSESSMENT AND PLAN / ED COURSE  I reviewed the triage vital signs and the nursing notes.                               Patient's presentation is most consistent with acute presentation with potential threat to life or bodily function.  Patient is a 34 y.o. female with a past medical history of hypertension, GERD, anemia, presenting to the emergency department for abnormal labs.  Patient had outpatient lab work which showed hemoglobin of 6.8.  On repeat here her hemoglobin is 6.3.  I discussed this result with the patient at length.  We discussed the risks and benefits of blood  transfusion and the patient gave both her verbal and her written informed consent.  Patient's care was signed out to the oncoming physician pending completion of blood transfusion with plan for discharge after improvement in symptoms.      FINAL CLINICAL IMPRESSION(S) / ED DIAGNOSES   Final diagnoses:  Symptomatic anemia     Rx / DC Orders   ED Discharge Orders     None        Note:  This document was prepared using Dragon voice recognition software and may include unintentional dictation errors.   Rexford Reche HERO, MD 06/17/24 1556  "

## 2024-06-17 NOTE — ED Triage Notes (Signed)
 Pt presents to ED from PCP for abnormal labs, reports iron  low and needs blood transfusion. Hgb 6.8. Endorses fatigue, dizziness.

## 2024-06-17 NOTE — ED Notes (Signed)
 Pt given DC instructions. Pt verbalized understanding of follow up care. Pt ambulatory from ED without difficulty.

## 2024-06-18 LAB — TYPE AND SCREEN
ABO/RH(D): O POS
Antibody Screen: NEGATIVE
Unit division: 0

## 2024-06-18 LAB — BPAM RBC
Blood Product Expiration Date: 202601302359
ISSUE DATE / TIME: 202601091525
Unit Type and Rh: 5100

## 2024-06-20 ENCOUNTER — Ambulatory Visit: Payer: Self-pay

## 2024-06-20 NOTE — Telephone Encounter (Signed)
 FYI Only or Action Required?: FYI only for provider: appointment scheduled on 06/22/24 with hematology.  Patient was last seen in primary care on 06/16/2024 by Melvin Pao, NP.  Called Nurse Triage reporting Headache and Fatigue.  Symptoms began over the past month.  Interventions attempted: Other: ED 1 unit blood transfusion.  Symptoms are: stable.  Triage Disposition: See Within 3 Days in Office (overriding See PCP When Office is Open (Within 3 Days))  Patient/caregiver understands and will follow disposition?: Yes             Message from Tiffini S sent at 06/20/2024  8:35 AM EST  Reason for Triage: Patient had a  blood transfusion in ED, have a follow up with HEMATOLOGY scheduled for 06/24/24 and wants to know if she should wait for the appointment or go have another blood transfusion in the ED  Her symptoms are minor for tired, headache, stomach pains- asked for information for work schedule today with employer to let her job know Please call the patient back at Mobile [phone]   Reason for Disposition  [1] MILD weakness (e.g., does not interfere with ability to work, go to school, normal activities) AND [2] persists > 1 week  Answer Assessment - Initial Assessment Questions 06/17/24 ED [facility] 1 unit of blood transfusion Has follow up appt with Hem/Onc 14thI feel okay, I just haven't noticed any big change since the transfusion   1. DESCRIPTION: Describe how you are feeling.     She states she has been feeling fatigued/tired after doing activities for a long time.  2. SEVERITY: How bad is it?  Can you stand and walk?     Mild. Able to stand and walk and perform normal activities.  3. ONSET: When did these symptoms begin? (e.g., hours, days, weeks, months)     The past month.  4. CAUSE: What do you think is causing the weakness or fatigue? (e.g., not drinking enough fluids, medical problem, trouble sleeping)     Anemia.  5. NEW  MEDICINES:  Have you started on any new medicines recently? (e.g., opioid pain medicines, benzodiazepines, muscle relaxants, antidepressants, antihistamines, neuroleptics, beta blockers)     Iron , started taking it the past 2 days.  6. OTHER SYMPTOMS: Do you have any other symptoms? (e.g., chest pain, fever, cough, SOB, vomiting, diarrhea, bleeding, other areas of pain)     Nausea; headaches started the day after her blood transfusion, resolved with ibuprofen  and returned the next day. No headache currently. No chest pain, SOB, bleeding, palpitations, black or tarry BM, vomiting. Unable to check HR/BP or SpO2.  7. PREGNANCY: Is there any chance you are pregnant? When was your last menstrual period?     LMP: 06/07/24.  Protocols used: Weakness (Generalized) and Fatigue-A-AH

## 2024-06-22 ENCOUNTER — Inpatient Hospital Stay: Attending: Oncology | Admitting: Oncology

## 2024-06-22 ENCOUNTER — Encounter: Payer: Self-pay | Admitting: Oncology

## 2024-06-22 ENCOUNTER — Inpatient Hospital Stay

## 2024-06-22 VITALS — BP 132/86 | HR 88 | Temp 98.3°F | Resp 18 | Wt 263.0 lb

## 2024-06-22 DIAGNOSIS — N920 Excessive and frequent menstruation with regular cycle: Secondary | ICD-10-CM | POA: Diagnosis not present

## 2024-06-22 DIAGNOSIS — D500 Iron deficiency anemia secondary to blood loss (chronic): Secondary | ICD-10-CM | POA: Diagnosis not present

## 2024-06-22 DIAGNOSIS — Z139 Encounter for screening, unspecified: Secondary | ICD-10-CM

## 2024-06-22 NOTE — Assessment & Plan Note (Signed)
 Labs are reviewed and discussed with patient. Lab Results  Component Value Date   HGB 6.3 (L) 06/17/2024   TIBC 352 06/16/2024   IRONPCTSAT 4 (LL) 06/16/2024   FERRITIN 5 (L) 06/16/2024    S/p 1 unit of PRBC transfusion. I discussed about  IV Venofer  treatments. I discussed about the potential risks including but not limited to allergic reactions/infusion reactions including anaphylactic reactions, diarrhea, phlebitis, high blood pressure, wheezing, SOB, skin rash, weight gain,dark urine, leg swelling, back pain, headache, nausea and fatigue, etc. Patient agrees with the plan.  Plan IV venofer  weekly x 4  Patient reports not being sexually active and denies any chance of pregnancy.

## 2024-06-22 NOTE — Assessment & Plan Note (Signed)
Recommend gynecology evaluation. 

## 2024-06-22 NOTE — Progress Notes (Signed)
 " Hematology/Oncology Consult note [phone] [phone]        REFERRING PROVIDER: Vicci Duwaine SQUIBB, DO   CHIEF COMPLAINTS/REASON FOR VISIT:  Evaluation of iron  deficiency anemia   ASSESSMENT & PLAN:   Iron  deficiency anemia due to chronic blood loss Labs are reviewed and discussed with patient. Lab Results  Component Value Date   HGB 6.3 (L) 06/17/2024   TIBC 352 06/16/2024   IRONPCTSAT 4 (LL) 06/16/2024   FERRITIN 5 (L) 06/16/2024    S/p 1 unit of PRBC transfusion. I discussed about  IV Venofer  treatments. I discussed about the potential risks including but not limited to allergic reactions/infusion reactions including anaphylactic reactions, diarrhea, phlebitis, high blood pressure, wheezing, SOB, skin rash, weight gain,dark urine, leg swelling, back pain, headache, nausea and fatigue, etc. Patient agrees with the plan.  Plan IV venofer  weekly x 4  Patient reports not being sexually active and denies any chance of pregnancy.  Menorrhagia Recommend gynecology evaluation   Orders Placed This Encounter  Procedures   CBC with Differential (Cancer Center Only)    Standing Status:   Future    Expected Date:   08/31/2024    Expiration Date:   11/29/2024   Iron  and TIBC    Standing Status:   Future    Expected Date:   08/31/2024    Expiration Date:   11/29/2024   Ferritin    Standing Status:   Future    Expected Date:   08/31/2024    Expiration Date:   11/29/2024   Retic Panel    Standing Status:   Future    Expected Date:   08/31/2024    Expiration Date:   11/29/2024   Follow-up in 10 weeks All questions were answered. The patient knows to call the clinic with any problems, questions or concerns.  Zelphia Cap, MD, PhD [facility] Hematology Oncology 06/22/2024   HISTORY OF PRESENTING ILLNESS:   Yesenia Andrade is a  34 y.o.  female with PMH listed below was seen in consultation at the request of  Vicci, Megan P, DO  for evaluation of iron   deficiency anemia  Discussed the use of AI scribe software for clinical note transcription with the patient, who gave verbal consent to proceed.   She continues to experience significant fatigue with minimal improvement following transfusion of one unit of blood on June 17, 2024. Minor exertion results in exhaustion, and she endorses mild dyspnea. She denies palpitations, dizziness, lightheadedness, or syncope. Appetite is preserved.  Hemoglobin has progressively declined over the past two years, from 11.1 g/dL in December 2023 to 8.0 g/dL in April 2025, and most recently to 6.8 g/dL on June 16, 2024, and 6.3 g/dL on June 17, 2024. She restarted oral iron  supplementation earlier this week and is tolerating it without gastrointestinal side effects, but had not been taking it consistently prior to this recent restart.  Menstrual history is notable for menses lasting approximately six days, with four to five days of very heavy flow requiring tampon changes every two hours. She previously considered her periods normal but now recognizes, after discussion with family, that her bleeding is heavier than average. Last menstrual period was at the beginning of January 2026, with no further bleeding since. She denies hematochezia, hematuria, or melena.  She reports having hypertension and takes antihypertensive medication. She denies baseline peripheral edema. She is not sexually active and denies any possibility of pregnancy. Following her recent transfusion, she experienced a transient headache that  resolved spontaneously.   MEDICAL HISTORY:  Past Medical History:  Diagnosis Date   GERD (gastroesophageal reflux disease)    Hypertension     SURGICAL HISTORY: Past Surgical History:  Procedure Laterality Date   CESAREAN SECTION  12/2013   CHOLECYSTECTOMY  03/2017   INCISIONAL HERNIA REPAIR N/A 04/29/2019   Procedure: LAPAROSCOPIC INCISIONAL HERNIA REPAIR WITH MESH;  Surgeon: Signe Mitzie LABOR,  MD;  Location: [facility];  Service: General;  Laterality: N/A;    SOCIAL HISTORY: Social History   Socioeconomic History   Marital status: Single    Spouse name: Not on file   Number of children: Not on file   Years of education: Not on file   Highest education level: Some college, no degree  Occupational History   Not on file  Tobacco Use   Smoking status: Never   Smokeless tobacco: Never  Vaping Use   Vaping status: Never Used  Substance and Sexual Activity   Alcohol use: No   Drug use: No   Sexual activity: Not on file  Other Topics Concern   Not on file  Social History Narrative   Not on file   Social Drivers of Health   Tobacco Use: Low Risk (06/22/2024)   Patient History    Smoking Tobacco Use: Never    Smokeless Tobacco Use: Never    Passive Exposure: Not on file  Financial Resource Strain: Low Risk (06/14/2024)   Overall Financial Resource Strain (CARDIA)    Difficulty of Paying Living Expenses: Not very hard  Food Insecurity: Patient Declined (06/21/2024)   Epic    Worried About Programme Researcher, Broadcasting/film/video in the Last Year: Patient declined    Barista in the Last Year: Patient declined  Recent Concern: Food Insecurity - Food Insecurity Present (06/14/2024)   Epic    Worried About Programme Researcher, Broadcasting/film/video in the Last Year: Sometimes true    Ran Out of Food in the Last Year: Patient declined  Transportation Needs: Unknown (06/21/2024)   Epic    Lack of Transportation (Medical): Patient declined    Lack of Transportation (Non-Medical): No  Physical Activity: Insufficiently Active (06/14/2024)   Exercise Vital Sign    Days of Exercise per Week: 2 days    Minutes of Exercise per Session: 30 min  Stress: Stress Concern Present (06/14/2024)   [employer] of Occupational Health - Occupational Stress Questionnaire    Feeling of Stress: To some extent  Social Connections: Moderately Integrated (06/14/2024)   Social Connection and Isolation Panel    Frequency of  Communication with Friends and Family: Three times a week    Frequency of Social Gatherings with Friends and Family: Once a week    Attends Religious Services: 1 to 4 times per year    Active Member of [employer] or Organizations: No    Attends Engineer, Structural: Not on file    Marital Status: Living with partner  Intimate Partner Violence: Not on file  Depression (PHQ2-9): Medium Risk (06/16/2024)   Depression (PHQ2-9)    PHQ-2 Score: 9  Alcohol Screen: Not on file  Housing: Low Risk (06/21/2024)   Epic    Unable to Pay for Housing in the Last Year: No    Number of Times Moved in the Last Year: 1    Homeless in the Last Year: No  Utilities: Not At Risk (06/21/2024)   Epic    Threatened with loss of utilities: No  Health Literacy: Not on  file    FAMILY HISTORY: Family History  Problem Relation Age of Onset   Hypertension Mother    Diabetes Father    Hypertension Father     ALLERGIES:  is allergic to lisinopril and oxycodone -acetaminophen .  MEDICATIONS:  Current Outpatient Medications  Medication Sig Dispense Refill   amLODipine  (NORVASC ) 5 MG tablet Take 1 tablet (5 mg total) by mouth daily. 60 tablet 0   Iron , Ferrous Sulfate , 325 (65 Fe) MG TABS Take 1 tablet by mouth daily. 30 tablet 2   omeprazole  (PRILOSEC) 20 MG capsule Take 1 capsule (20 mg total) by mouth daily. 60 capsule 0   No current facility-administered medications for this visit.    Review of Systems  Constitutional:  Positive for fatigue. Negative for appetite change, chills and fever.  HENT:   Negative for hearing loss and voice change.   Eyes:  Negative for eye problems.  Respiratory:  Negative for chest tightness and cough.   Cardiovascular:  Negative for chest pain.  Gastrointestinal:  Negative for abdominal distention, abdominal pain and blood in stool.  Endocrine: Negative for hot flashes.  Genitourinary:  Positive for menstrual problem. Negative for difficulty urinating and frequency.    Musculoskeletal:  Negative for arthralgias.  Skin:  Negative for itching and rash.  Neurological:  Negative for extremity weakness.  Hematological:  Negative for adenopathy.  Psychiatric/Behavioral:  Negative for confusion.    PHYSICAL EXAMINATION:  Vitals:   06/22/24 1401 06/22/24 1411  BP: (!) 132/93 132/86  Pulse: 88   Resp: 18   Temp: 98.3 F (36.8 C)   SpO2: 100%    Filed Weights   06/22/24 1401  Weight: 263 lb (119.3 kg)    Physical Exam Constitutional:      General: She is not in acute distress. HENT:     Head: Normocephalic and atraumatic.  Eyes:     General: No scleral icterus. Cardiovascular:     Rate and Rhythm: Normal rate and regular rhythm.  Pulmonary:     Effort: Pulmonary effort is normal. No respiratory distress.     Breath sounds: Normal breath sounds. No wheezing.  Abdominal:     General: Bowel sounds are normal. There is no distension.     Palpations: Abdomen is soft.  Musculoskeletal:        General: No deformity. Normal range of motion.     Cervical back: Normal range of motion and neck supple.  Skin:    General: Skin is warm and dry.     Findings: No erythema or rash.  Neurological:     Mental Status: She is alert and oriented to person, place, and time. Mental status is at baseline.  Psychiatric:        Mood and Affect: Mood normal.     LABORATORY DATA:  I have reviewed the data as listed    Latest Ref Rng & Units 06/17/2024   11:40 AM 06/16/2024    2:22 PM 10/02/2023    1:35 AM  CBC  WBC 4.0 - 10.5 K/uL 4.4  4.7  5.7   Hemoglobin 12.0 - 15.0 g/dL 6.3  6.8  8.0   Hematocrit 36.0 - 46.0 % 24.5  25.5  29.2   Platelets 150 - 400 K/uL 318  337  376       Latest Ref Rng & Units 06/17/2024   11:40 AM 10/02/2023    1:35 AM 01/06/2023    8:28 AM  CMP  Glucose 70 - 99 mg/dL 891  117  99   BUN 6 - 20 mg/dL 9  12  14    Creatinine 0.44 - 1.00 mg/dL 9.26  9.03  9.18   Sodium 135 - 145 mmol/L 142  136  139   Potassium 3.5 - 5.1 mmol/L 4.2   3.6  4.5   Chloride 98 - 111 mmol/L 108  105  103   CO2 22 - 32 mmol/L 26  23  24    Calcium 8.9 - 10.3 mg/dL 9.2  8.7  9.1   Total Protein 6.5 - 8.1 g/dL 8.4     Total Bilirubin 0.0 - 1.2 mg/dL 0.6     Alkaline Phos 38 - 126 U/L 95     AST 15 - 41 U/L 20     ALT 0 - 44 U/L 16         RADIOGRAPHIC STUDIES: I have personally reviewed the radiological images as listed and agreed with the findings in the report. No results found.       "

## 2024-06-23 NOTE — Addendum Note (Signed)
 Addended by: BARI SIDRA MATSU on: 06/23/2024 10:02 AM   Modules accepted: Orders

## 2024-06-24 ENCOUNTER — Inpatient Hospital Stay

## 2024-06-24 ENCOUNTER — Inpatient Hospital Stay: Admitting: Oncology

## 2024-06-24 VITALS — BP 130/89 | HR 86 | Temp 97.1°F | Resp 16

## 2024-06-24 DIAGNOSIS — D500 Iron deficiency anemia secondary to blood loss (chronic): Secondary | ICD-10-CM

## 2024-06-24 MED ORDER — IRON SUCROSE 20 MG/ML IV SOLN
200.0000 mg | Freq: Once | INTRAVENOUS | Status: AC
  Administered 2024-06-24: 200 mg via INTRAVENOUS
  Filled 2024-06-24: qty 10

## 2024-06-24 NOTE — Progress Notes (Signed)
 [facility] Clinical Social Work  Clinical Social Work was referred by medical provider for SDOH needs.  Clinical Social Work Intern contacted patient by phone to offer support and assess for needs.   Patient declined any assistance with SDOH concerns at this time. Intern encouraged patient to call back if future needs arise.   Yesenia Andrade  Clinical Social Work Intern [employer]

## 2024-06-24 NOTE — Patient Instructions (Signed)

## 2024-06-27 ENCOUNTER — Ambulatory Visit: Admitting: Family Medicine

## 2024-06-29 ENCOUNTER — Inpatient Hospital Stay

## 2024-06-29 VITALS — BP 116/81 | HR 82 | Temp 97.8°F | Resp 17

## 2024-06-29 DIAGNOSIS — D500 Iron deficiency anemia secondary to blood loss (chronic): Secondary | ICD-10-CM

## 2024-06-29 MED ORDER — IRON SUCROSE 20 MG/ML IV SOLN
200.0000 mg | Freq: Once | INTRAVENOUS | Status: AC
  Administered 2024-06-29: 200 mg via INTRAVENOUS
  Filled 2024-06-29: qty 10

## 2024-06-29 MED ORDER — SODIUM CHLORIDE 0.9% FLUSH
10.0000 mL | Freq: Once | INTRAVENOUS | Status: AC | PRN
  Administered 2024-06-29: 10 mL
  Filled 2024-06-29: qty 10

## 2024-06-29 NOTE — Patient Instructions (Signed)

## 2024-06-29 NOTE — Progress Notes (Signed)
 Patient tolerated Venofer  well, no questions/concerns voiced. Monitored 30 min post transfusion. Patient stable at discharge. VSS. Refused AVS .

## 2024-07-05 ENCOUNTER — Inpatient Hospital Stay

## 2024-07-12 ENCOUNTER — Inpatient Hospital Stay: Attending: Oncology

## 2024-07-12 VITALS — BP 118/80 | HR 82 | Temp 97.5°F | Resp 18

## 2024-07-12 DIAGNOSIS — D500 Iron deficiency anemia secondary to blood loss (chronic): Secondary | ICD-10-CM

## 2024-07-12 MED ORDER — IRON SUCROSE 20 MG/ML IV SOLN
200.0000 mg | Freq: Once | INTRAVENOUS | Status: AC
  Administered 2024-07-12: 200 mg via INTRAVENOUS

## 2024-07-12 NOTE — Patient Instructions (Signed)
 Iron  Sucrose Injection What is this medication? IRON  SUCROSE (EYE ern SOO krose) treats low levels of iron  (iron  deficiency anemia) in people with kidney disease. Iron  is a mineral that plays an important role in making red blood cells, which carry oxygen from your lungs to the rest of your body. This medicine may be used for other purposes; ask your health care provider or pharmacist if you have questions. COMMON BRAND NAME(S): Venofer  What should I tell my care team before I take this medication? They need to know if you have any of these conditions: Anemia not caused by low iron  levels Heart disease High levels of iron  in the blood Kidney disease Liver disease An unusual or allergic reaction to iron , other medications, foods, dyes, or preservatives Pregnant or trying to get pregnant Breastfeeding How should I use this medication? This medication is infused into a vein. It is given by your care team in a hospital or clinic setting. Talk to your care team about the use of this medication in children. While it may be prescribed for children as young as 2 years for selected conditions, precautions do apply. Overdosage: If you think you have taken too much of this medicine contact a poison control center or emergency room at once. NOTE: This medicine is only for you. Do not share this medicine with others. What if I miss a dose? Keep appointments for follow-up doses. It is important not to miss your dose. Call your care team if you are unable to keep an appointment. What may interact with this medication? Do not take this medication with any of the following: Deferoxamine Dimercaprol Other iron  products This medication may also interact with the following: Chloramphenicol Deferasirox This list may not describe all possible interactions. Give your health care provider a list of all the medicines, herbs, non-prescription drugs, or dietary supplements you use. Also tell them if you smoke,  drink alcohol, or use illegal drugs. Some items may interact with your medicine. What should I watch for while using this medication? Your condition will be monitored carefully while you are receiving this medication. Tell your care team if your symptoms do not start to get better or if they get worse. You may need blood work done while you are taking this medication. Sometimes, when medications are infused into veins, a little can leak out of the vein and into the tissue around it. If this medication leaks, it can cause a brown or dark stain on the skin. This is not common. It may be permanent. If you feel pain or swelling during your infusion, tell your care team right away. They can stop the infusion and treat the area. You may need to eat more foods that contain iron . Talk to your care team. Foods that contain iron  include whole grains or cereals, dried fruits, beans, peas, leafy green vegetables, and organ meats (liver, kidney). What side effects may I notice from receiving this medication? Side effects that you should report to your care team as soon as possible: Allergic reactions--skin rash, itching, hives, swelling of the face, lips, tongue, or throat Low blood pressure--dizziness, feeling faint or lightheaded, blurry vision Painful swelling, warmth, or redness of the skin, brown or dark skin color at the infusion site Shortness of breath Side effects that usually do not require medical attention (report these to your care team if they continue or are bothersome): Flushing Headache Joint pain Muscle pain Nausea This list may not describe all possible side effects. Call your  doctor for medical advice about side effects. You may report side effects to FDA at 1-800-FDA-1088. Where should I keep my medication? This medication is given in a hospital or clinic. It will not be stored at home. NOTE: This sheet is a summary. It may not cover all possible information. If you have questions about  this medicine, talk to your doctor, pharmacist, or health care provider.  2025 Elsevier/Gold Standard (2024-04-13 00:00:00)

## 2024-07-19 ENCOUNTER — Inpatient Hospital Stay

## 2024-07-19 ENCOUNTER — Encounter: Admitting: Family Medicine

## 2024-09-06 ENCOUNTER — Inpatient Hospital Stay

## 2024-09-09 ENCOUNTER — Inpatient Hospital Stay: Admitting: Oncology

## 2024-09-09 ENCOUNTER — Inpatient Hospital Stay
